# Patient Record
Sex: Male | Born: 1985
Health system: Southern US, Community
[De-identification: ages and names within clinical notes are randomized; demographics above are authoritative.]

## PROBLEM LIST (undated history)

## (undated) DIAGNOSIS — E559 Vitamin D deficiency, unspecified: Secondary | ICD-10-CM

## (undated) DIAGNOSIS — E291 Testicular hypofunction: Secondary | ICD-10-CM

## (undated) DIAGNOSIS — I10 Essential (primary) hypertension: Secondary | ICD-10-CM

## (undated) HISTORY — DX: Vitamin D deficiency, unspecified: E55.9

## (undated) HISTORY — PX: KNEE ARTHROSCOPY: SHX127

## (undated) HISTORY — DX: Essential (primary) hypertension: I10

## (undated) HISTORY — PX: WISDOM TOOTH EXTRACTION: SHX21

## (undated) HISTORY — DX: Testicular hypofunction: E29.1

---

## 2013-01-21 ENCOUNTER — Other Ambulatory Visit: Payer: Self-pay | Admitting: Physician Assistant

## 2013-01-21 MED ORDER — TESTOSTERONE CYPIONATE 200 MG/ML IM SOLN: INTRAMUSCULAR | Status: DC

## 2013-01-25 ENCOUNTER — Other Ambulatory Visit: Payer: Self-pay | Admitting: Internal Medicine

## 2013-01-25 ENCOUNTER — Encounter: Payer: Self-pay | Admitting: Internal Medicine

## 2013-01-25 MED ORDER — AMPHETAMINE-DEXTROAMPHETAMINE 20 MG PO TABS: ORAL_TABLET | ORAL | Status: DC

## 2013-03-12 ENCOUNTER — Other Ambulatory Visit: Payer: Self-pay | Admitting: Internal Medicine

## 2013-03-14 ENCOUNTER — Encounter: Payer: Self-pay | Admitting: Internal Medicine

## 2013-03-14 ENCOUNTER — Ambulatory Visit (INDEPENDENT_AMBULATORY_CARE_PROVIDER_SITE_OTHER): Payer: 59 | Admitting: Internal Medicine

## 2013-03-14 VITALS — BP 130/84 | HR 76 | Temp 99.3°F | Resp 16 | Wt 239.8 lb

## 2013-03-14 DIAGNOSIS — E291 Testicular hypofunction: Secondary | ICD-10-CM

## 2013-03-14 DIAGNOSIS — E559 Vitamin D deficiency, unspecified: Secondary | ICD-10-CM

## 2013-03-14 DIAGNOSIS — E349 Endocrine disorder, unspecified: Secondary | ICD-10-CM | POA: Insufficient documentation

## 2013-03-14 DIAGNOSIS — I10 Essential (primary) hypertension: Secondary | ICD-10-CM | POA: Insufficient documentation

## 2013-03-14 DIAGNOSIS — F988 Other specified behavioral and emotional disorders with onset usually occurring in childhood and adolescence: Secondary | ICD-10-CM

## 2013-03-14 MED ORDER — CITALOPRAM HYDROBROMIDE 40 MG PO TABS: 40.0000 mg | ORAL_TABLET | Freq: Every day | ORAL | Status: DC

## 2013-03-14 MED ORDER — AMPHETAMINE-DEXTROAMPHETAMINE 20 MG PO TABS: ORAL_TABLET | ORAL | Status: DC

## 2013-03-14 MED ORDER — ALPRAZOLAM 1 MG PO TABS: ORAL_TABLET | ORAL | Status: DC

## 2013-03-14 NOTE — Patient Instructions (Signed)
Testosterone injection What is this medicine? TESTOSTERONE (tes TOS ter one) is the main male hormone. It supports normal male development such as muscle growth, facial hair, and deep voice. It is used in males to treat low testosterone levels. This medicine may be used for other purposes; ask your health care provider or pharmacist if you have questions. COMMON BRAND NAME(S): Andro-L.A., Aveed, Delatestryl, Depo-Testosterone, Virilon What should I tell my health care provider before I take this medicine? They need to know if you have any of these conditions: -breast cancer -diabetes -heart disease -kidney disease -liver disease -lung disease -prostate cancer, enlargement -an unusual or allergic reaction to testosterone, other medicines, foods, dyes, or preservatives -pregnant or trying to get pregnant -breast-feeding How should I use this medicine? This medicine is for injection into a muscle. It is usually given by a health care professional in a hospital or clinic setting. Contact your pediatrician regarding the use of this medicine in children. While this medicine may be prescribed for children as young as 2 years of age for selected conditions, precautions do apply. Overdosage: If you think you have taken too much of this medicine contact a poison control center or emergency room at once. NOTE: This medicine is only for you. Do not share this medicine with others. What if I miss a dose? Try not to miss a dose. Your doctor or health care professional will tell you when your next injection is due. Notify the office if you are unable to keep an appointment. What may interact with this medicine? -medicines for diabetes -medicines that treat or prevent blood clots like warfarin -oxyphenbutazone -propranolol -steroid medicines like prednisone or cortisone This list may not describe all possible interactions. Give your health care provider a list of all the medicines, herbs,  non-prescription drugs, or dietary supplements you use. Also tell them if you smoke, drink alcohol, or use illegal drugs. Some items may interact with your medicine. What should I watch for while using this medicine? Visit your doctor or health care professional for regular checks on your progress. They will need to check the level of testosterone in your blood. This medicine may affect blood sugar levels. If you have diabetes, check with your doctor or health care professional before you change your diet or the dose of your diabetic medicine. This drug is banned from use in athletes by most athletic organizations. What side effects may I notice from receiving this medicine? Side effects that you should report to your doctor or health care professional as soon as possible: -allergic reactions like skin rash, itching or hives, swelling of the face, lips, or tongue -breast enlargement -breathing problems -changes in mood, especially anger, depression, or rage -dark urine -general ill feeling or flu-like symptoms -light-colored stools -loss of appetite, nausea -nausea, vomiting -right upper belly pain -stomach pain -swelling of ankles -too frequent or persistent erections -trouble passing urine or change in the amount of urine -unusually weak or tired -yellowing of the eyes or skin Additional side effects that can occur in women include: -deep or hoarse voice -facial hair growth -irregular menstrual periods Side effects that usually do not require medical attention (report to your doctor or health care professional if they continue or are bothersome): -acne -change in sex drive or performance -hair loss -headache This list may not describe all possible side effects. Call your doctor for medical advice about side effects. You may report side effects to FDA at 1-800-FDA-1088. Where should I keep my medicine? Keep  out of the reach of children. This medicine can be abused. Keep your  medicine in a safe place to protect it from theft. Do not share this medicine with anyone. Selling or giving away this medicine is dangerous and against the law. Store at room temperature between 20 and 25 degrees C (68 and 77 degrees F). Do not freeze. Protect from light. Follow the directions for the product you are prescribed. Throw away any unused medicine after the expiration date. NOTE: This sheet is a summary. It may not cover all possible information. If you have questions about this medicine, talk to your doctor, pharmacist, or health care provider.  2014, Elsevier/Gold Standard. (2007-04-13 16:13:46)  Vitamin D Deficiency Vitamin D is an important vitamin that your body needs. Having too little of it in your body is called a deficiency. A very bad deficiency can make your bones soft and can cause a condition called rickets.  Vitamin D is important to your body for different reasons, such as:   It helps your body absorb 2 minerals called calcium and phosphorus.  It helps make your bones healthy.  It may prevent some diseases, such as diabetes and multiple sclerosis.  It helps your muscles and heart. You can get vitamin D in several ways. It is a natural part of some foods. The vitamin is also added to some dairy products and cereals. Some people take vitamin D supplements. Also, your body makes vitamin D when you are in the sun. It changes the sun's rays into a form of the vitamin that your body can use. CAUSES   Not eating enough foods that contain vitamin D.  Not getting enough sunlight.  Having certain digestive system diseases that make it hard to absorb vitamin D. These diseases include Crohn's disease, chronic pancreatitis, and cystic fibrosis.  Having a surgery in which part of the stomach or small intestine is removed.  Being obese. Fat cells pull vitamin D out of your blood. That means that obese people may not have enough vitamin D left in their blood and in other body  tissues.  Having chronic kidney or liver disease. RISK FACTORS Risk factors are things that make you more likely to develop a vitamin D deficiency. They include:  Being older.  Not being able to get outside very much.  Living in a nursing home.  Having had broken bones.  Having weak or thin bones (osteoporosis).  Having a disease or condition that changes how your body absorbs vitamin D.  Having dark skin.  Some medicines such as seizure medicines or steroids.  Being overweight or obese. SYMPTOMS Mild cases of vitamin D deficiency may not have any symptoms. If you have a very bad case, symptoms may include:  Bone pain.  Muscle pain.  Falling often.  Broken bones caused by a minor injury, due to osteoporosis. DIAGNOSIS A blood test is the best way to tell if you have a vitamin D deficiency. TREATMENT Vitamin D deficiency can be treated in different ways. Treatment for vitamin D deficiency depends on what is causing it. Options include:  Taking vitamin D supplements.  Taking a calcium supplement. Your caregiver will suggest what dose is best for you. HOME CARE INSTRUCTIONS  Take any supplements that your caregiver prescribes. Follow the directions carefully. Take only the suggested amount.  Have your blood tested 2 months after you start taking supplements.  Eat foods that contain vitamin D. Healthy choices include:  Fortified dairy products, cereals, or juices. Fortified  means vitamin D has been added to the food. Check the label on the package to be sure.  Fatty fish like salmon or trout.  Eggs.  Oysters.  Do not use a tanning bed.  Keep your weight at a healthy level. Lose weight if you need to.  Keep all follow-up appointments. Your caregiver will need to perform blood tests to make sure your vitamin D deficiency is going away. SEEK MEDICAL CARE IF:  You have any questions about your treatment.  You continue to have symptoms of vitamin D  deficiency.  You have nausea or vomiting.  You are constipated.  You feel confused.  You have severe abdominal or back pain. MAKE SURE YOU:  Understand these instructions.  Will watch your condition.  Will get help right away if you are not doing well or get worse. Document Released: 04/25/2011 Document Revised: 05/28/2012 Document Reviewed: 04/25/2011 [facility] Patient Information 2014 [place].

## 2013-03-15 LAB — TESTOSTERONE: TESTOSTERONE: 484 ng/dL (ref 300–890)

## 2013-03-15 LAB — VITAMIN D 25 HYDROXY (VIT D DEFICIENCY, FRACTURES): Vit D, 25-Hydroxy: 44 ng/mL (ref 30–89)

## 2013-03-15 NOTE — Progress Notes (Signed)
Patient ID: Ernest Richmond, male   DOB: 12-02-1985, 28 y.o.   MRN: [id number]   This very nice 28 y.o. SWM presents for follow up with Hx/o labile Hypertension,  mild Hyperlipidemia, Testosterone and Vitamin Deficiency and ADD.    Today's BP: 130/84 mmHg . Patient denies any cardiac type chest pain, palpitations, dyspnea/orthopnea/PND, dizziness, claudication, or dependent edema.   Hyperlipidemia is controlled with diet. Last Cholesterol was  194, Triglycerides were 211, HDL 50 and LDL 102 - near goal. Pateint continues to feel improved stamina and sense of well being on Testosterone replacement therapy   Also, the patient has history of ADD and has been doing well judiciously using his Adderall 1&1/2 to 2 tabs usually only on work days and reports improved work functions and concentration with the medication. He also reports using his evening dosing of alprazolam allowing him anxiety relief and avoiding use of ETOH.   Further, Patient has history of Vitamin D Deficiency and he supplements vitamin D without any suspected side-effects.    Medication List         ALPRAZolam 1 MG tablet  Commonly known as:  XANAX  1/2 to 1 tablet 2 or 3  X daily as needed for anxiety     amphetamine-dextroamphetamine 20 MG tablet  Commonly known as:  ADDERALL  1/2 to 1 tablet 2 or 3 x daily for ADD     cholecalciferol 1000 UNITS tablet  Commonly known as:  VITAMIN D  Take 1,000 Units by mouth 3 (three) times daily.     citalopram 40 MG tablet  Commonly known as:  CELEXA  Take 1 tablet (40 mg total) by mouth daily. For mood and anxiety     testosterone cypionate 200 MG/ML injection  Commonly known as:  DEPO-TESTOSTERONE  3 cc q 2 weeks or as directed         No Known Allergies  PMHx:   Past Medical History  Diagnosis Date  . Hypertension   . Hypogonadism male   . Vitamin D deficiency     FHx:    Reviewed / unchanged  SHx:    Reviewed / unchanged  Systems Review: Constitutional: Denies  fever, chills, wt changes, headaches, insomnia, fatigue, night sweats, change in appetite. Eyes: Denies redness, blurred vision, diplopia, discharge, itchy, watery eyes.  ENT: Denies discharge, congestion, post nasal drip, epistaxis, sore throat, earache, hearing loss, dental pain, tinnitus, vertigo, sinus pain, snoring.  CV: Denies chest pain, palpitations, irregular heartbeat, syncope, dyspnea, diaphoresis, orthopnea, PND, claudication, edema. Respiratory: denies cough, dyspnea, DOE, pleurisy, hoarseness, laryngitis, wheezing.  Gastrointestinal: Denies dysphagia, odynophagia, heartburn, reflux, water brash, abdominal pain or cramps, nausea, vomiting, bloating, diarrhea, constipation, hematemesis, melena, hematochezia,  or hemorrhoids. Genitourinary: Denies dysuria, frequency, urgency, nocturia, hesitancy, discharge, hematuria, flank pain. Musculoskeletal: Denies arthralgias, myalgias, stiffness, jt. swelling, pain, limp, strain/sprain.  Skin: Denies pruritus, rash, hives, warts, acne, eczema, change in skin lesion(s). Neuro: No weakness, tremor, incoordination, spasms, paresthesia, or pain. Psychiatric: Denies confusion, memory loss, or sensory loss. Endo: Denies change in weight, skin, hair change.  Heme/Lymph: No excessive bleeding, bruising, orenlarged lymph nodes.  BP: 130/84  Pulse: 76  Temp: 99.3 F (37.4 C)  Resp: 16    On Exam:  Appears well nourished - in no distress. HEENT - neg Neck: Supple. Thyroid nl. Car 2+/2+ without bruits, nodes or JVD. Chest: Respirations nl with BS clear & equal w/o rales, rhonchi, wheezing or stridor.  Cor: Heart sounds normal w/ regular rate and rhythm  without sig. murmurs, gallops, clicks, or rubs. Peripheral pulses normal and equal  without edema.  Musculoskeletal: Full ROM all peripheral extremities, joint stability, 5/5 strength, and normal gait.  Skin: Warm, dry without exposed rashes, lesions, ecchymosis apparent.  Neuro: Cranial nerves  intact, reflexes equal bilaterally. Sensory-motor testing grossly intact. Tendon reflexes grossly intact.  Pysch: Alert & oriented x 3. Insight and judgement nl & appropriate. No ideations.  Assessment and Plan:  1. Elevated, labile - Continue monitor blood pressures  2. Hyperlipidemia - Continue diet & exerciseand ontinue monitor periodic cholesterol checks   3. ADD - continue periodic monitoring of meds and compliance.  4. Vitamin D Deficiency - Continue supplementation.  5. Testosterone Deficiency  Recommended regular exercise, BP monitoring, weight control, and discussed med and SE's.

## 2013-03-21 ENCOUNTER — Telehealth: Payer: Self-pay | Admitting: *Deleted

## 2013-03-21 ENCOUNTER — Other Ambulatory Visit: Payer: Self-pay | Admitting: Internal Medicine

## 2013-03-21 DIAGNOSIS — F988 Other specified behavioral and emotional disorders with onset usually occurring in childhood and adolescence: Secondary | ICD-10-CM

## 2013-03-21 MED ORDER — AMPHETAMINE-DEXTROAMPHETAMINE 20 MG PO TABS: ORAL_TABLET | ORAL | Status: DC

## 2013-03-21 NOTE — Telephone Encounter (Signed)
Patient called concerning labs.  Per Dr Melford Aase, testosterone level good but vitamin D low at 44. Patient needs to increase vitamin D from 3000 units to 6000 units daily.  Patient adevised he needs to pick up RX for Adderall.

## 2013-05-23 ENCOUNTER — Other Ambulatory Visit: Payer: Self-pay | Admitting: Internal Medicine

## 2013-05-23 DIAGNOSIS — E291 Testicular hypofunction: Secondary | ICD-10-CM

## 2013-05-23 DIAGNOSIS — F988 Other specified behavioral and emotional disorders with onset usually occurring in childhood and adolescence: Secondary | ICD-10-CM

## 2013-05-23 MED ORDER — "NEEDLE (DISP) 21G X 1"" MISC": Status: DC

## 2013-05-23 MED ORDER — AMPHETAMINE-DEXTROAMPHETAMINE 20 MG PO TABS: ORAL_TABLET | ORAL | Status: DC

## 2013-06-21 ENCOUNTER — Ambulatory Visit (INDEPENDENT_AMBULATORY_CARE_PROVIDER_SITE_OTHER): Payer: 59 | Admitting: Internal Medicine

## 2013-06-21 ENCOUNTER — Encounter: Payer: Self-pay | Admitting: Internal Medicine

## 2013-06-21 VITALS — BP 122/72 | HR 72 | Temp 98.1°F | Resp 18 | Ht 72.5 in | Wt 242.8 lb

## 2013-06-21 DIAGNOSIS — E782 Mixed hyperlipidemia: Secondary | ICD-10-CM | POA: Insufficient documentation

## 2013-06-21 DIAGNOSIS — I10 Essential (primary) hypertension: Secondary | ICD-10-CM

## 2013-06-21 DIAGNOSIS — Z113 Encounter for screening for infections with a predominantly sexual mode of transmission: Secondary | ICD-10-CM

## 2013-06-21 DIAGNOSIS — Z79899 Other long term (current) drug therapy: Secondary | ICD-10-CM | POA: Insufficient documentation

## 2013-06-21 DIAGNOSIS — Z0000 Encounter for general adult medical examination without abnormal findings: Secondary | ICD-10-CM

## 2013-06-21 DIAGNOSIS — R7401 Elevation of levels of liver transaminase levels: Secondary | ICD-10-CM

## 2013-06-21 DIAGNOSIS — E291 Testicular hypofunction: Secondary | ICD-10-CM

## 2013-06-21 DIAGNOSIS — Z1212 Encounter for screening for malignant neoplasm of rectum: Secondary | ICD-10-CM

## 2013-06-21 DIAGNOSIS — R740 Nonspecific elevation of levels of transaminase and lactic acid dehydrogenase [LDH]: Secondary | ICD-10-CM

## 2013-06-21 DIAGNOSIS — E559 Vitamin D deficiency, unspecified: Secondary | ICD-10-CM

## 2013-06-21 DIAGNOSIS — F988 Other specified behavioral and emotional disorders with onset usually occurring in childhood and adolescence: Secondary | ICD-10-CM

## 2013-06-21 DIAGNOSIS — Z111 Encounter for screening for respiratory tuberculosis: Secondary | ICD-10-CM

## 2013-06-21 LAB — HEMOGLOBIN A1C
HEMOGLOBIN A1C: 5.2 % (ref <5.7)
Mean Plasma Glucose: 103 mg/dL (ref <117)

## 2013-06-21 LAB — CBC WITH DIFFERENTIAL/PLATELET
Basophils Absolute: 0 10*3/uL (ref 0.0–0.1)
Basophils Relative: 0 % (ref 0–1)
EOS PCT: 2 % (ref 0–5)
Eosinophils Absolute: 0.1 10*3/uL (ref 0.0–0.7)
HCT: 44.9 % (ref 39.0–52.0)
HEMOGLOBIN: 15.4 g/dL (ref 13.0–17.0)
LYMPHS ABS: 1.2 10*3/uL (ref 0.7–4.0)
Lymphocytes Relative: 24 % (ref 12–46)
MCH: 31.5 pg (ref 26.0–34.0)
MCHC: 34.3 g/dL (ref 30.0–36.0)
MCV: 91.8 fL (ref 78.0–100.0)
MONOS PCT: 7 % (ref 3–12)
Monocytes Absolute: 0.3 10*3/uL (ref 0.1–1.0)
Neutro Abs: 3.3 10*3/uL (ref 1.7–7.7)
Neutrophils Relative %: 67 % (ref 43–77)
Platelets: 204 10*3/uL (ref 150–400)
RBC: 4.89 MIL/uL (ref 4.22–5.81)
RDW: 13.7 % (ref 11.5–15.5)
WBC: 4.9 10*3/uL (ref 4.0–10.5)

## 2013-06-21 MED ORDER — TESTOSTERONE CYPIONATE 200 MG/ML IM SOLN: INTRAMUSCULAR | Status: DC

## 2013-06-21 MED ORDER — ASPIRIN EC 81 MG PO TBEC: 81.0000 mg | DELAYED_RELEASE_TABLET | Freq: Every day | ORAL | Status: AC

## 2013-06-21 MED ORDER — ALPRAZOLAM 1 MG PO TABS: ORAL_TABLET | ORAL | Status: DC

## 2013-06-21 MED ORDER — AMPHETAMINE-DEXTROAMPHETAMINE 20 MG PO TABS: ORAL_TABLET | ORAL | Status: DC

## 2013-06-21 MED ORDER — "NEEDLE (DISP) 21G X 1"" MISC": Status: DC

## 2013-06-21 MED ORDER — CITALOPRAM HYDROBROMIDE 40 MG PO TABS: 40.0000 mg | ORAL_TABLET | Freq: Every day | ORAL | Status: DC

## 2013-06-21 MED ORDER — ALPRAZOLAM 1 MG PO TABS: ORAL_TABLET | ORAL | Status: AC

## 2013-06-21 NOTE — Progress Notes (Signed)
Patient ID: Ernest Richmond, male   DOB: [birth year], 28 y.o.   MRN: [id number]   Annual Screening Comprehensive Examination  This very nice 28 y.o.  SWM presents for complete physical.  Patient has been followed for labile HTN,  Hyperlipidemia and Vitamin D Deficiency.   Labile HTN predates since 2011 and has been monitored expectantly. Patient's BP has been controlled and today's BP: 122/72 mmHg. Patient denies any cardiac symptoms as chest pain, palpitations, shortness of breath, dizziness or ankle swelling.   Patient's hyperlipidemia is controlled with diet and medications. Patient denies myalgias or other medication SE's. Last cholesterol last visit was 194, triglycerides 211, HDL 50 and LDL 102.     Patient is screened for  prediabetes  and last A1c 4.7% / insulin 7. Patient denies reactive hypoglycemic symptoms, visual blurring, diabetic polys or paresthesias.    Finally, patient has history of Vitamin D Deficiency of 8 in 2011 and last vitamin D was 35 in May 2014 and pt was advised to increase dose of Vit D.    Medication List     ALPRAZolam 1 MG tablet  Commonly known as:  XANAX  1/2 to 1 tablet 2 or 3  X daily as needed for anxiety     amphetamine-dextroamphetamine 20 MG tablet  Commonly known as:  ADDERALL  1/2 to 1 tablet 2 or 3 x daily for ADD     aspirin EC 81 MG tablet  Take 1 tablet (81 mg total) by mouth daily.     cholecalciferol 1000 UNITS tablet  Commonly known as:  VITAMIN D  Take 1,000 Units by mouth 3 (three) times daily.     citalopram 40 MG tablet  Commonly known as:  CELEXA  Take 1 tablet (40 mg total) by mouth daily. For mood and anxiety     multivitamin tablet  Take 1 tablet by mouth daily.     NEEDLE (DISP) 21 G 21G X 1" Misc  Commonly known as:  YALE DISP NEEDLES 21GX1"  1 & 1/2 "     21 G needles and 3 cc syringes     testosterone cypionate 200 MG/ML injection  Commonly known as:  DEPO-TESTOSTERONE  3 cc q 2 weeks or as directed       No  Known Allergies  Past Medical History  Diagnosis Date  . Hypertension   . Hypogonadism male   . Vitamin D deficiency    Past Surgical History  Procedure Laterality Date  . Wisdom tooth extraction    . Knee arthroscopy Left    Family History  Problem Relation Age of Onset  . Arthritis Mother   . Hyperlipidemia Father   . Hypertension Father   . Diabetes Father   . Gout Father    History   Social History  . Marital Status: Single    Spouse Name: N/A    Number of Children: N/A  . Years of Education: N/A   Occupational History  . Works as Company secretary for [employer] History Main Topics  . Smoking status: Former Smoker -- 0.60 packs/day    Quit date: 03/17/2012  . Smokeless tobacco: No  . Alcohol Use: 7.0 oz/week    14 drink(s) per week     Comment: beer  . Drug Use: No  . Sexual Activity: Active   Social History Narrative  . cohabitates with GF of 5 yrs    ROS Constitutional: Denies fever, chills, weight loss/gain, headaches, insomnia, fatigue, night sweats  or change in appetite. Eyes: Denies redness, blurred vision, diplopia, discharge, itchy or watery eyes.  ENT: Denies discharge, congestion, post nasal drip, epistaxis, sore throat, earache, hearing loss, dental pain, Tinnitus, Vertigo, Sinus pain or snoring.  Cardio: Denies chest pain, palpitations, irregular heartbeat, syncope, dyspnea, diaphoresis, orthopnea, PND, claudication or edema Respiratory: denies cough, dyspnea, DOE, pleurisy, hoarseness, laryngitis or wheezing.  Gastrointestinal: Denies dysphagia, heartburn, reflux, water brash, pain, cramps, nausea, vomiting, bloating, diarrhea, constipation, hematemesis, melena, hematochezia, jaundice or hemorrhoids Genitourinary: Denies dysuria, frequency, urgency, nocturia, hesitancy, discharge, hematuria or flank pain Musculoskeletal: Denies arthralgia, myalgia, stiffness, Jt. Swelling, pain, limp or strain/sprain. Skin: Denies puritis, rash,  hives, warts, acne, eczema or change in skin lesion Neuro: No weakness, tremor, incoordination, spasms, paresthesia or pain Psychiatric: Denies confusion, memory loss or sensory loss Endocrine: Denies change in weight, skin, hair change, nocturia, and paresthesia, diabetic polys, visual blurring or hyper / hypo glycemic episodes.  Heme/Lymph: No excessive bleeding, bruising or enlarged lymph nodes.  Physical Exam  BP 122/72  Pulse 72  Temp(Src) 98.1 F (36.7 C) (Temporal)  Resp 18  Ht 6' 0.5" (1.842 m)  Wt 242 lb 12.8 oz (110.133 kg)  BMI 32.46 kg/m2  General Appearance: Well nourished, in no apparent distress. Eyes: PERRLA, EOMs, conjunctiva no swelling or erythema, normal fundi and vessels. Sinuses: No frontal/maxillary tenderness ENT/Mouth: EACs patent / TMs  nl. Nares clear without erythema, swelling, mucoid exudates. Oral hygiene is good. No erythema, swelling, or exudate. Tongue normal, non-obstructing. Tonsils not swollen or erythematous. Hearing normal.  Neck: Supple, thyroid normal. No bruits, nodes or JVD. Respiratory: Respiratory effort normal.  BS equal and clear bilateral without rales, rhonci, wheezing or stridor. Cardio: Heart sounds are normal with regular rate and rhythm and no murmurs, rubs or gallops. Peripheral pulses are normal and equal bilaterally without edema. No aortic or femoral bruits. Chest: symmetric with normal excursions and percussion.  Abdomen: Flat, soft, with bowl sounds. Nontender, no guarding, rebound, hernias, masses, or organomegaly.  Lymphatics: Non tender without lymphadenopathy.  Genitourinary: No hernias.Testes nl. Musculoskeletal: Full ROM all peripheral extremities, joint stability, 5/5 strength, and normal gait. Skin: Warm and dry without rashes, lesions, cyanosis, clubbing or  ecchymosis.  Neuro: Cranial nerves intact, reflexes equal bilaterally. Normal muscle tone, no cerebellar symptoms. Sensation intact.  Pysch: Awake and oriented X 3,  normal affect, insight and judgment appropriate.   Assessment and Plan  1. Annual Screening Examination 2. Hypertension, labile 3. Hyperlipidemia 4. Testosterone Derficiency 5. Vitamin D Deficiency 6. ADD   Continue prudent diet as discussed, weight control, BP monitoring, regular exercise, and medications as discussed.  Discussed med effects and SE's. Routine screening labs and tests as requested with regular follow-up as recommended. Also recommended take Low dose ASA 81 mg daily.  ROV 6 mo

## 2013-06-21 NOTE — Patient Instructions (Signed)

## 2013-06-22 LAB — URINALYSIS, MICROSCOPIC ONLY
BACTERIA UA: NONE SEEN
CASTS: NONE SEEN
Crystals: NONE SEEN
Squamous Epithelial / LPF: NONE SEEN

## 2013-06-22 LAB — BASIC METABOLIC PANEL WITH GFR
BUN: 18 mg/dL (ref 6–23)
CALCIUM: 9.4 mg/dL (ref 8.4–10.5)
CO2: 23 meq/L (ref 19–32)
CREATININE: 0.95 mg/dL (ref 0.50–1.35)
Chloride: 105 mEq/L (ref 96–112)
GFR, Est African American: >89 mL/min
GFR, Est Non African American: >89 mL/min
GLUCOSE: 84 mg/dL (ref 70–99)
Potassium: 4.4 mEq/L (ref 3.5–5.3)
Sodium: 139 mEq/L (ref 135–145)

## 2013-06-22 LAB — LIPID PANEL
Cholesterol: 151 mg/dL (ref 0–200)
HDL: 39 mg/dL — ABNORMAL LOW (ref >39)
LDL CALC: 90 mg/dL (ref 0–99)
Total CHOL/HDL Ratio: 3.9 Ratio
Triglycerides: 111 mg/dL (ref <150)
VLDL: 22 mg/dL (ref 0–40)

## 2013-06-22 LAB — MICROALBUMIN / CREATININE URINE RATIO
Creatinine, Urine: 138.5 mg/dL
Microalb Creat Ratio: 3.6 mg/g (ref 0.0–30.0)
Microalb, Ur: 0.5 mg/dL (ref 0.00–1.89)

## 2013-06-22 LAB — HEPATIC FUNCTION PANEL
ALBUMIN: 4.6 g/dL (ref 3.5–5.2)
ALK PHOS: 68 U/L (ref 39–117)
ALT: 22 U/L (ref 0–53)
AST: 26 U/L (ref 0–37)
Bilirubin, Direct: 0.1 mg/dL (ref 0.0–0.3)
Indirect Bilirubin: 0.4 mg/dL (ref 0.2–1.2)
Total Bilirubin: 0.5 mg/dL (ref 0.2–1.2)
Total Protein: 6.9 g/dL (ref 6.0–8.3)

## 2013-06-22 LAB — INSULIN, FASTING: INSULIN FASTING, SERUM: 4 u[IU]/mL (ref 3–28)

## 2013-06-22 LAB — HEPATITIS C ANTIBODY: HCV Ab: NEGATIVE

## 2013-06-22 LAB — TESTOSTERONE: Testosterone: 810 ng/dL (ref 300–890)

## 2013-06-22 LAB — VITAMIN D 25 HYDROXY (VIT D DEFICIENCY, FRACTURES): VIT D 25 HYDROXY: 43 ng/mL (ref 30–89)

## 2013-06-22 LAB — MAGNESIUM: MAGNESIUM: 2.1 mg/dL (ref 1.5–2.5)

## 2013-06-22 LAB — VITAMIN B12: Vitamin B-12: 344 pg/mL (ref 211–911)

## 2013-06-22 LAB — HEPATITIS B SURFACE ANTIBODY,QUALITATIVE: Hep B S Ab: NEGATIVE

## 2013-06-22 LAB — HEPATITIS A ANTIBODY, TOTAL: Hep A Total Ab: NONREACTIVE

## 2013-06-22 LAB — HEPATITIS B CORE ANTIBODY, TOTAL: Hep B Core Total Ab: NONREACTIVE

## 2013-06-22 LAB — TSH: TSH: 0.468 u[IU]/mL (ref 0.350–4.500)

## 2013-06-24 LAB — HEPATITIS B E ANTIBODY: HEPATITIS BE ANTIBODY: NONREACTIVE

## 2013-06-24 LAB — TB SKIN TEST
INDURATION: 0 mm
TB SKIN TEST: NEGATIVE

## 2013-08-14 ENCOUNTER — Other Ambulatory Visit: Payer: Self-pay | Admitting: *Deleted

## 2013-08-14 DIAGNOSIS — F988 Other specified behavioral and emotional disorders with onset usually occurring in childhood and adolescence: Secondary | ICD-10-CM

## 2013-08-14 MED ORDER — AMPHETAMINE-DEXTROAMPHETAMINE 20 MG PO TABS: ORAL_TABLET | ORAL | Status: DC

## 2013-09-05 ENCOUNTER — Ambulatory Visit (INDEPENDENT_AMBULATORY_CARE_PROVIDER_SITE_OTHER): Payer: 59

## 2013-09-05 ENCOUNTER — Ambulatory Visit (INDEPENDENT_AMBULATORY_CARE_PROVIDER_SITE_OTHER): Payer: 59 | Admitting: Emergency Medicine

## 2013-09-05 VITALS — BP 122/86 | HR 70 | Temp 98.2°F | Resp 16 | Ht 71.25 in | Wt 245.0 lb

## 2013-09-05 DIAGNOSIS — M79609 Pain in unspecified limb: Secondary | ICD-10-CM

## 2013-09-05 DIAGNOSIS — M79641 Pain in right hand: Secondary | ICD-10-CM

## 2013-09-05 DIAGNOSIS — S62309A Unspecified fracture of unspecified metacarpal bone, initial encounter for closed fracture: Secondary | ICD-10-CM

## 2013-09-05 NOTE — Progress Notes (Signed)
[facility] [address], [place] [address] [phone]  Date:  09/05/2013   Name:  Ernest Richmond   DOB:  Sep 08, 1985   MRN:  [id number]  PCP:  Alesia Richards, MD    Chief Complaint: Hand Pain   History of Present Illness:  Ernest Richmond is a 28 y.o. very pleasant male patient who presents with the following:  Punched a wall while on vacation.  Has pain and swelling and tenderness third MCP.  No improvement with over the counter medications or other home remedies. Denies other complaint or health concern today.   Patient Active Problem List   Diagnosis Date Noted  . Hyperlipidemia 06/21/2013  . Encounter for long-term (current) use of other medications 06/21/2013  . ADD (attention deficit disorder) 06/21/2013  . Labile Hypertension 03/14/2013  . Testosterone Deficiency 03/14/2013  . Vitamin D Deficiency 03/14/2013    Past Medical History  Diagnosis Date  . Hypertension   . Hypogonadism male   . Vitamin D deficiency     Past Surgical History  Procedure Laterality Date  . Wisdom tooth extraction    . Knee arthroscopy Left     History  Substance Use Topics  . Smoking status: Former Smoker -- 0.00 packs/day    Quit date: 03/17/2012  . Smokeless tobacco: Not on file  . Alcohol Use: 10.0 oz/week    20 drink(s) per week     Comment: beer    Family History  Problem Relation Age of Onset  . Arthritis Mother   . Hyperlipidemia Father   . Hypertension Father   . Diabetes Father   . Gout Father     No Known Allergies  Medication list has been reviewed and updated.  Current Outpatient Prescriptions on File Prior to Visit  Medication Sig Dispense Refill  . ALPRAZolam (XANAX) 1 MG tablet 1/2 to 1 tablet 2 or 3  X daily as needed for anxiety  90 tablet  5  . amphetamine-dextroamphetamine (ADDERALL) 20 MG tablet 1/2 to 1 tablet 2 or 3 x daily for ADD  90 tablet  0  . aspirin EC 81 MG tablet Take 1 tablet (81 mg total) by mouth daily.  150  tablet  2  . cholecalciferol (VITAMIN D) 1000 UNITS tablet Take 1,000 Units by mouth 3 (three) times daily.      . citalopram (CELEXA) 40 MG tablet Take 1 tablet (40 mg total) by mouth daily. For mood and anxiety  90 tablet  99  . Multiple Vitamin (MULTIVITAMIN) tablet Take 1 tablet by mouth daily.      Marland Kitchen NEEDLE, DISP, 21 G (YALE DISP NEEDLES 21GX1") 21G X 1" MISC 1 & 1/2 "     21 G needles and 3 cc syringes  25 each  99  . testosterone cypionate (DEPO-TESTOSTERONE) 200 MG/ML injection 3 cc q 2 weeks or as directed  10 mL  3   No current facility-administered medications on file prior to visit.    Review of Systems:  As per HPI, otherwise negative.    Physical Examination: Filed Vitals:   09/05/13 0914  BP: 122/86  Pulse: 70  Temp: 98.2 F (36.8 C)  Resp: 16   Filed Vitals:   09/05/13 0914  Height: 5' 11.25" (1.81 m)  Weight: 245 lb (111.131 kg)   Body mass index is 33.92 kg/(m^2). Ideal Body Weight: Weight in (lb) to have BMI = 25: 180.1   GEN: WDWN, NAD, Non-toxic, Alert & Oriented x  3 HEENT: Atraumatic, Normocephalic.  Ears and Nose: No external deformity. EXTR: No clubbing/cyanosis/edema NEURO: Normal gait.  PSYCH: Normally interactive. Conversant. Not depressed or anxious appearing.  Calm demeanor.  RIGHT hand:  Tender swollen third MCP.  No deformity.  Guards finger   Assessment and Plan: Fracture 3rd MCP Ulnar gutter Ortho  Signed,  Ellison Carwin, MD   [facility] reading (PRIMARY) by  Dr. Ouida Sills.  fx 3rd MCP.

## 2013-09-11 ENCOUNTER — Telehealth: Payer: Self-pay

## 2013-09-11 NOTE — Telephone Encounter (Signed)
Latisha from [facility] is needing more details about if the patient has a fracture or not when she called the patient he told her that it was not a fracture but our referral states it was   She would like a call back at [phone] and then she will be able to call the patient back and schedule

## 2013-09-11 NOTE — Telephone Encounter (Signed)
Attempted call, no answer Per image findings: There is no evidence of fracture or dislocation

## 2013-09-13 NOTE — Telephone Encounter (Signed)
Spoke to [employer]- gave information on imaging results.

## 2013-09-13 NOTE — Telephone Encounter (Signed)
Katie requested imaging report faxed to her attention- [phone]. I have faxed as requested.

## 2013-10-25 ENCOUNTER — Other Ambulatory Visit: Payer: Self-pay | Admitting: Internal Medicine

## 2013-10-25 DIAGNOSIS — F988 Other specified behavioral and emotional disorders with onset usually occurring in childhood and adolescence: Secondary | ICD-10-CM

## 2013-10-25 MED ORDER — AMPHETAMINE-DEXTROAMPHETAMINE 20 MG PO TABS: ORAL_TABLET | ORAL | Status: DC

## 2013-12-18 ENCOUNTER — Other Ambulatory Visit: Payer: Self-pay | Admitting: Internal Medicine

## 2013-12-18 DIAGNOSIS — F988 Other specified behavioral and emotional disorders with onset usually occurring in childhood and adolescence: Secondary | ICD-10-CM

## 2013-12-18 MED ORDER — AMPHETAMINE-DEXTROAMPHETAMINE 20 MG PO TABS
ORAL_TABLET | ORAL | Status: DC
Start: 2013-12-18 — End: 2014-02-18

## 2013-12-24 ENCOUNTER — Encounter: Payer: Self-pay | Admitting: Internal Medicine

## 2013-12-24 ENCOUNTER — Ambulatory Visit: Payer: 59 | Admitting: Internal Medicine

## 2013-12-24 NOTE — Progress Notes (Signed)
Patient ID: Ernest Richmond, male   DOB: [birth year], 28 y.o.   MRN: [id number]  R E S C H E D U L E D     AT      T H E      L A S T       M I N U T E

## 2014-01-07 ENCOUNTER — Ambulatory Visit: Payer: 59 | Admitting: Internal Medicine

## 2014-01-07 NOTE — Progress Notes (Signed)
Patient ID: Ernest Richmond, male   DOB: 06/02/1985, 28 y.o.   MRN: [id number] [name]

## 2014-02-13 ENCOUNTER — Other Ambulatory Visit: Payer: Self-pay | Admitting: Internal Medicine

## 2014-02-18 ENCOUNTER — Encounter: Payer: Self-pay | Admitting: Physician Assistant

## 2014-02-18 ENCOUNTER — Ambulatory Visit (INDEPENDENT_AMBULATORY_CARE_PROVIDER_SITE_OTHER): Payer: 59 | Admitting: Physician Assistant

## 2014-02-18 VITALS — BP 140/84 | HR 74 | Temp 98.6°F | Resp 16 | Ht 72.5 in | Wt 260.0 lb

## 2014-02-18 DIAGNOSIS — F419 Anxiety disorder, unspecified: Secondary | ICD-10-CM

## 2014-02-18 DIAGNOSIS — L719 Rosacea, unspecified: Secondary | ICD-10-CM

## 2014-02-18 DIAGNOSIS — E782 Mixed hyperlipidemia: Secondary | ICD-10-CM

## 2014-02-18 DIAGNOSIS — I10 Essential (primary) hypertension: Secondary | ICD-10-CM

## 2014-02-18 DIAGNOSIS — Z789 Other specified health status: Secondary | ICD-10-CM

## 2014-02-18 DIAGNOSIS — R7309 Other abnormal glucose: Secondary | ICD-10-CM

## 2014-02-18 DIAGNOSIS — E349 Endocrine disorder, unspecified: Secondary | ICD-10-CM

## 2014-02-18 DIAGNOSIS — E559 Vitamin D deficiency, unspecified: Secondary | ICD-10-CM

## 2014-02-18 DIAGNOSIS — F988 Other specified behavioral and emotional disorders with onset usually occurring in childhood and adolescence: Secondary | ICD-10-CM

## 2014-02-18 DIAGNOSIS — Z79899 Other long term (current) drug therapy: Secondary | ICD-10-CM

## 2014-02-18 LAB — CBC WITH DIFFERENTIAL/PLATELET
BASOS ABS: 0 10*3/uL (ref 0.0–0.1)
BASOS PCT: 0 % (ref 0–1)
Eosinophils Absolute: 0.2 10*3/uL (ref 0.0–0.7)
Eosinophils Relative: 3 % (ref 0–5)
HCT: 46.4 % (ref 39.0–52.0)
HEMOGLOBIN: 15.9 g/dL (ref 13.0–17.0)
LYMPHS ABS: 1.1 10*3/uL (ref 0.7–4.0)
Lymphocytes Relative: 18 % (ref 12–46)
MCH: 31.8 pg (ref 26.0–34.0)
MCHC: 34.3 g/dL (ref 30.0–36.0)
MCV: 92.8 fL (ref 78.0–100.0)
MPV: 9.9 fL (ref 8.6–12.4)
Monocytes Absolute: 0.5 10*3/uL (ref 0.1–1.0)
Monocytes Relative: 9 % (ref 3–12)
NEUTROS PCT: 70 % (ref 43–77)
Neutro Abs: 4.2 10*3/uL (ref 1.7–7.7)
Platelets: 183 10*3/uL (ref 150–400)
RBC: 5 MIL/uL (ref 4.22–5.81)
RDW: 13.8 % (ref 11.5–15.5)
WBC: 6 10*3/uL (ref 4.0–10.5)

## 2014-02-18 MED ORDER — BUPROPION HCL ER (XL) 150 MG PO TB24: 150.0000 mg | ORAL_TABLET | ORAL | Status: DC

## 2014-02-18 MED ORDER — TESTOSTERONE CYPIONATE 200 MG/ML IM SOLN: INTRAMUSCULAR | Status: DC

## 2014-02-18 MED ORDER — AMPHETAMINE-DEXTROAMPHETAMINE 20 MG PO TABS: ORAL_TABLET | ORAL | Status: DC

## 2014-02-18 MED ORDER — CITALOPRAM HYDROBROMIDE 40 MG PO TABS: 40.0000 mg | ORAL_TABLET | Freq: Every day | ORAL | Status: DC

## 2014-02-18 MED ORDER — METRONIDAZOLE 0.75 % EX CREA: TOPICAL_CREAM | Freq: Two times a day (BID) | CUTANEOUS | Status: DC

## 2014-02-18 NOTE — Patient Instructions (Addendum)
-Continue medications as prescribed. -Start taking Metronidazole for rosacea for 2 weeks. Please cut back on alcohol use. Please follow recommended diet (low carb, lots of vegetables and white meat) and exercise (120 minutes per week).  Can come back in 3 months for lab only appt if you decide to stop taking testosterone.   Please follow up in 6 months for appt with labs.  Rosacea Rosacea is a long-term (chronic) condition that affects the skin of the face (cheeks, nose, brow, and chin) and sometimes the eyes. Rosacea causes the blood vessels near the surface of the skin to enlarge, resulting in redness. This condition usually begins after age 65. It occurs most often in light-skinned women. Without treatment, rosacea tends to get worse over time. There is no cure for rosacea, but treatment can help control your symptoms. CAUSES  The cause is unknown. It is thought that some people may inherit a tendency to develop rosacea. Certain triggers can make your rosacea worse, including:  Hot baths.  Exercise.  Sunlight.  Very hot or cold temperatures.  Hot or spicy foods and drinks.  Drinking alcohol.  Stress.  Taking blood pressure medicine.  Long-term use of topical steroids on the face. SYMPTOMS   Redness of the face.  Red bumps or pimples on the face.  Red, enlarged nose (rhinophyma).  Blushing easily.  Red lines on the skin.  Irritated or burning feeling in the eyes.  Swollen eyelids. DIAGNOSIS  Your caregiver can usually tell what is wrong by asking about your symptoms and performing a physical exam. TREATMENT  Avoiding triggers is an important part of treatment. You will also need to see a skin specialist (dermatologist) who can develop a treatment plan for you. The goals of treatment are to control your condition and to improve the appearance of your skin. It may take several weeks or months of treatment before you notice an improvement in your skin. Even after your  skin improves, you will likely need to continue treatment to prevent your rosacea from coming back. Treatment methods may include:  Using sunscreen or sunblock daily to protect the skin.  Antibiotic medicine, such as metronidazole, applied directly to the skin.  Antibiotics taken by mouth. This is usually prescribed if you have eye problems from your rosacea.  Laser surgery to improve the appearance of the skin. This surgery can reduce the appearance of red lines on the skin and can remove excess tissue from the nose to reduce its size. HOME CARE INSTRUCTIONS  Avoid things that seem to trigger your flare-ups.  If you are given antibiotics, take them as directed. Finish them even if you start to feel better.  Use a gentle facial cleanser that does not contain alcohol.  You may use a mild facial moisturizer.  Use a sunscreen or sunblock with SPF 30 or greater.  Wear a green-tinted foundation powder to conceal redness, if needed. Choose cosmetics that are noncomedogenic. This means they do not block your pores.  If your eyelids are affected, apply warm compresses to the eyelids. Do this up to 4 times a day or as directed by your caregiver. SEEK MEDICAL CARE IF:  Your skin problems get worse.  You feel depressed.  You lose your appetite.  You have trouble concentrating.  You have problems with your eyes, such as redness or itching. MAKE SURE YOU:  Understand these instructions.  Will watch your condition.  Will get help right away if you are not doing well or get worse.  Document Released: 03/10/2004 Document Revised: 08/02/2011 Document Reviewed: 01/11/2011 [facility] Patient Information 2015 [place], [place]. This information is not intended to replace advice given to you by your health care provider. Make sure you discuss any questions you have with your health care provider.  Testosterone This test is used to determine if your testosterone level is abnormal. This could be  used to explain difficulty getting an erection (erectile dysfunction), inability of your partner to get pregnant (infertility), premature or delayed puberty if you are male, or the appearance of masculine physical features if you are male. PREPARATION FOR TEST A blood sample is obtained by inserting a needle into a vein in the arm. NORMAL FINDINGS  Free Testosterone: 0.3-2 pg/mL  % Free Testosterone: 0.1%-0.3% Total Testosterone:  7 mos-9 yrs (Tanner Stage I)  Male: Less than 30 ng/dL  Male: Less than 30 ng/dL  10-13 yrs (Tanner Stage II)  Male: Less than 300 ng/dL  Male: Less than 40 ng/dL  14-15 yrs (Tanner Stage III)  Male: 170-540 ng/dL  Male: Less than 60 ng/dL  16-19 yrs (Tanner Stage IV, V)  Male: 250-910 ng/dL  Male: Less than 70 ng/dL  20 yrs and over  Male: [phone] ng/dL  Male: Less than 70 ng/dL Ranges for normal findings may vary among different laboratories and hospitals. You should always check with your doctor after having lab work or other tests done to discuss the meaning of your test results and whether your values are considered within normal limits. MEANING OF TEST  Your caregiver will go over the test results with you and discuss the importance and meaning of your results, as well as treatment options and the need for additional tests if necessary. OBTAINING THE TEST RESULTS It is your responsibility to obtain your test results. Ask the lab or department performing the test when and how you will get your results. Document Released: 02/18/2004 Document Revised: 04/25/2011 Document Reviewed: 05/29/2013 [facility] Patient Information 2015 [place], [place]. This information is not intended to replace advice given to you by your health care provider. Make sure you discuss any questions you have with your health care provider.

## 2014-02-18 NOTE — Progress Notes (Addendum)
Assessment and Plan:  1. Essential hypertension Monitor blood pressure at home.  Reminder to go to the ER if any CP, SOB, nausea, dizziness, severe HA, changes vision/speech, left arm numbness and tingling, and jaw pain. - CBC with Differential - BASIC METABOLIC PANEL WITH GFR - Hepatic function panel  2. Hyperlipidemia Please follow recommended diet and exercise.  Check cholesterol level. - Lipid panel  3. Abnormal glucose, screening Please follow recommended diet and exercise.  Check A1C and Insulin levels. - Hemoglobin A1c - Insulin, fasting  4. Vitamin D deficiency Continue vitamin D as prescribed.  Check vitamin D level. - Vit D  25 hydroxy (rtn osteoporosis monitoring)  5. Testosterone deficiency -Continue testosterone as prescribed- testosterone cypionate (DEPO-TESTOSTERONE) 200 MG/ML injection; 3 cc q 2 weeks or as directed  Dispense: 10 mL; Refill: 3.  Check testosterone level. Patient states he wants to recheck in 3 months due to not taking testosterone and trying weight loss and foods that are suppose to help with testosterone. - Testosterone  6. ADD (attention deficit disorder) -Continue Adderall as prescribed. ADD-  Helps with focus, no AE's. The patient was counseled on the addictive nature of the medication and was encouraged to take drug holidays when not needed. - amphetamine-dextroamphetamine (ADDERALL) 20 MG tablet; 1 tablet 3 x daily for ADD  Dispense: 90 tablet; Refill: 0  7. Medication management Will monitor kidney and liver function. - CBC with Differential - BASIC METABOLIC PANEL WITH GFR - Hepatic function panel  8. Rosacea -Please take Metronidazole as prescribed for 2 weeks- metroNIDAZOLE (METROCREAM) 0.75 % cream; Apply topically 2 (two) times daily. For 2 weeks. Avoid contact with eyes  Dispense: 45 g; Refill: 0 -Told patient to cut back on alcohol consumption.  9. Anxiety -Start taking Wellbutrin as prescribed- buPROPion (WELLBUTRIN XL) 150 MG 24  hr tablet; Take 1 tablet (150 mg total) by mouth every morning.  Dispense: 30 tablet; Refill: 0 -Stop taking Xanax due to alcohol use.  Spoke with Vicie Mutters, PA-C and Dr. Melford Aase and they agreed to no longer prescribe Xanax due to alcohol use.  Dr. Melford Aase agreed that he should try Wellbutrin to help with alcohol use and anxiety.    Told patient to use Mineral oil or cooking oil (vegetable oil) to help with dry skin in ears.  Told patient to not use Q-Tips to clean ears. Continue diet and meds as discussed. Further disposition pending results of labs. Discussed medication effects and SE's.  Pt agreed to treatment plan. Please follow up in 1 month for medication check. Please follow up in May 2016 for physical.  HPI A Caucasian 29 y.o. male  presents for 8 month follow up with hypertension, hyperlipidemia, abnormal glucose level and vitamin D.  Patient's last visit was 06/21/13 with labs.  Patient states he drinks 4 beers per day for total of 28 beers per week.  Patient does admit to drinking more on weekends.  Patient states Dr. Melford Aase put him on Xanax to help decrease his alcohol use.  Patient states he takes Xanax as needed.  Patient states he tried Celexa before and had side effects of ED.  Patient states he tried Wellbutrin before, but does not remember if it worked or not.  Patient was taking Xanax 1mg - 1 tablet TID PRN.    His blood pressure has been controlled at home, today their BP is BP: 140/84 mmHg He does not workout, has new year's resolution to loose weight. He denies chest pain, shortness of  breath, dizziness.   He is not on cholesterol medication and denies myalgias. His cholesterol is not at goal. The cholesterol last visit was:   Lab Results  Component Value Date   CHOL 151 06/21/2013   HDL 39* 06/21/2013   LDLCALC 90 06/21/2013   TRIG 111 06/21/2013   CHOLHDL 3.9 06/21/2013   He has not been working on diet and exercise for abnormal glucose level, and denies increased  appetite, polydipsia and polyuria. Last A1C in the office was:  Lab Results  Component Value Date   HGBA1C 5.2 06/21/2013  Number of meals per day? 4   B- breakfast sandwich - [place] sausage and egg white  L-  Grilled chicken sandwiches or fajitas and salad  D- cooks dinner- meat (chicken, steak or pork), vegetables, potatoes, bread  Snacks? Chips and salsa  Beverages?  Water and seltzer   Patient is on Vitamin D supplement. - Multivitamin and Vitamin D- 4,000-5,000  IU units Lab Results  Component Value Date   VD25OH 43 06/21/2013   ADD- Currently taking Adderall 20mg - 1 tablet TID- helps with work and helps him concentrate.  Works for [employer].  Rash- Patient states he notices a rash on his face that has been there for about 2 years.  Patient states he does drink about 4 beers a day.  Testosterone Deficiency- Last testosterone injection was 1 week ago.  Patient states he would like to get off testosterone if possible.  Patient wants to try weight loss and look up foods that are said to help testosterone and would like to recheck testosterone in 3 months.    Current Medications:  Current Outpatient Prescriptions on File Prior to Visit  Medication Sig Dispense Refill  . amphetamine-dextroamphetamine (ADDERALL) 20 MG tablet 1/2 to 1 tablet 2 or 3 x daily for ADD 90 tablet 0  . aspirin EC 81 MG tablet Take 1 tablet (81 mg total) by mouth daily. 150 tablet 2  . citalopram (CELEXA) 40 MG tablet Take 1 tablet (40 mg total) by mouth daily. For mood and anxiety 90 tablet 99  . Multiple Vitamin (MULTIVITAMIN) tablet Take 1 tablet by mouth daily.    Marland Kitchen NEEDLE, DISP, 21 G (YALE DISP NEEDLES 21GX1") 21G X 1" MISC 1 & 1/2 "     21 G needles and 3 cc syringes 25 each 99  . testosterone cypionate (DEPO-TESTOSTERONE) 200 MG/ML injection 3 cc q 2 weeks or as directed 10 mL 3  . cholecalciferol (VITAMIN D) 1000 UNITS tablet Take 1,000 Units by mouth 3 (three) times daily.     No current  facility-administered medications on file prior to visit.   Medical History:  Past Medical History  Diagnosis Date  . Hypertension   . Hypogonadism male   . Vitamin D deficiency    Allergies: No Known Allergies   ROS- Review of Systems  Constitutional: Negative.   HENT: Negative.   Eyes: Negative.   Respiratory: Negative.   Cardiovascular: Negative.   Gastrointestinal: Negative.   Genitourinary: Negative.   Musculoskeletal: Negative.   Skin: Positive for rash.       Patient has butterfly rash on face that started 2 years ago.  Patient denies any symptoms with rash, except for that it is there.  Neurological: Negative.   Psychiatric/Behavioral: The patient is nervous/anxious.    Family history- Review and unchanged Social history- Review and unchanged  Physical Exam: BP 140/84 mmHg  Pulse 74  Temp(Src) 98.6 F (37 C) (Temporal)  Resp 16  Ht 6' 0.5" (1.842 m)  Wt 260 lb (117.935 kg)  BMI 34.76 kg/m2  SpO2 98% Wt Readings from Last 3 Encounters:  02/18/14 260 lb (117.935 kg)  09/05/13 245 lb (111.131 kg)  06/21/13 242 lb 12.8 oz (110.133 kg)  Vitals Reviewed. General Appearance: Well nourished, in no apparent distress. Obese. Eyes: PERRLA, EOMIs, conjunctiva no swelling or erythema.  No scleral icterus. Sinuses: No Frontal/maxillary tenderness ENT/Mouth: Ext aud canals clear, TMs without erythema, edema or bulging. No erythema, swelling, or exudate on posterior pharynx.  Tonsils not swollen or erythematous. Hearing normal.  Neck: Supple, thyroid normal.  Respiratory: Respiratory effort normal, CTAB.  No w/r/r or stridor.  Cardio: RRR with no M/R/Gs. Brisk peripheral pulses without edema.  Abdomen: Soft, + normal BS.  Non tender, no guarding, rebound, hernias, masses. Lymphatics: Non tender without lymphadenopathy.  Musculoskeletal: Full ROM, 5/5 strength, normal gait.  Skin: Warm, dry intact with erythematous butterfly rash on face that is maculopapular and without  pustules and non-pruritic.  No lesions or ecchymosis.  Neuro: Cranial nerves intact. Normal muscle tone, no cerebellar symptoms. Sensation intact.  Psych: Awake and oriented X 3, normal affect, Insight and Judgment appropriate.   Teal Raben, Stephani Police, PA-C 9:15 AM [facility]

## 2014-02-19 LAB — LIPID PANEL
CHOLESTEROL: 203 mg/dL — AB (ref 0–200)
HDL: 46 mg/dL (ref >39)
LDL CALC: 115 mg/dL — AB (ref 0–99)
Total CHOL/HDL Ratio: 4.4 Ratio
Triglycerides: 209 mg/dL — ABNORMAL HIGH (ref <150)
VLDL: 42 mg/dL — ABNORMAL HIGH (ref 0–40)

## 2014-02-19 LAB — HEMOGLOBIN A1C
HEMOGLOBIN A1C: 4.9 % (ref <5.7)
MEAN PLASMA GLUCOSE: 94 mg/dL (ref <117)

## 2014-02-19 LAB — INSULIN, FASTING: Insulin fasting, serum: 6.4 u[IU]/mL (ref 2.0–19.6)

## 2014-02-19 LAB — BASIC METABOLIC PANEL WITH GFR
BUN: 18 mg/dL (ref 6–23)
CALCIUM: 9.3 mg/dL (ref 8.4–10.5)
CO2: 28 meq/L (ref 19–32)
CREATININE: 0.88 mg/dL (ref 0.50–1.35)
Chloride: 105 mEq/L (ref 96–112)
GFR, Est African American: >89 mL/min
GLUCOSE: 80 mg/dL (ref 70–99)
Potassium: 4.3 mEq/L (ref 3.5–5.3)
SODIUM: 139 meq/L (ref 135–145)

## 2014-02-19 LAB — HEPATIC FUNCTION PANEL
ALBUMIN: 4.5 g/dL (ref 3.5–5.2)
ALT: 33 U/L (ref 0–53)
AST: 28 U/L (ref 0–37)
Alkaline Phosphatase: 67 U/L (ref 39–117)
Bilirubin, Direct: 0.2 mg/dL (ref 0.0–0.3)
Indirect Bilirubin: 0.5 mg/dL (ref 0.2–1.2)
TOTAL PROTEIN: 7 g/dL (ref 6.0–8.3)
Total Bilirubin: 0.7 mg/dL (ref 0.2–1.2)

## 2014-02-19 LAB — VITAMIN D 25 HYDROXY (VIT D DEFICIENCY, FRACTURES): Vit D, 25-Hydroxy: 33 ng/mL (ref 30–100)

## 2014-02-19 LAB — TESTOSTERONE: Testosterone: 864 ng/dL (ref 300–890)

## 2014-03-24 ENCOUNTER — Other Ambulatory Visit: Payer: Self-pay | Admitting: *Deleted

## 2014-03-24 DIAGNOSIS — F419 Anxiety disorder, unspecified: Secondary | ICD-10-CM

## 2014-03-24 MED ORDER — BUPROPION HCL ER (XL) 150 MG PO TB24: 150.0000 mg | ORAL_TABLET | ORAL | Status: DC

## 2014-04-11 ENCOUNTER — Ambulatory Visit (INDEPENDENT_AMBULATORY_CARE_PROVIDER_SITE_OTHER): Payer: 59 | Admitting: Physician Assistant

## 2014-04-11 VITALS — BP 130/82 | HR 68 | Temp 97.9°F | Resp 16 | Ht 72.5 in | Wt 252.0 lb

## 2014-04-11 DIAGNOSIS — E349 Endocrine disorder, unspecified: Secondary | ICD-10-CM

## 2014-04-11 DIAGNOSIS — E291 Testicular hypofunction: Secondary | ICD-10-CM

## 2014-04-11 DIAGNOSIS — E669 Obesity, unspecified: Secondary | ICD-10-CM

## 2014-04-11 DIAGNOSIS — F419 Anxiety disorder, unspecified: Secondary | ICD-10-CM

## 2014-04-11 DIAGNOSIS — F988 Other specified behavioral and emotional disorders with onset usually occurring in childhood and adolescence: Secondary | ICD-10-CM

## 2014-04-11 DIAGNOSIS — G4700 Insomnia, unspecified: Secondary | ICD-10-CM

## 2014-04-11 DIAGNOSIS — I10 Essential (primary) hypertension: Secondary | ICD-10-CM

## 2014-04-11 DIAGNOSIS — R0683 Snoring: Secondary | ICD-10-CM

## 2014-04-11 LAB — TESTOSTERONE: TESTOSTERONE: 196 ng/dL — AB (ref 300–890)

## 2014-04-11 MED ORDER — ALPRAZOLAM 1 MG PO TABS: 1.0000 mg | ORAL_TABLET | Freq: Every evening | ORAL | Status: DC | PRN

## 2014-04-11 MED ORDER — AMPHETAMINE-DEXTROAMPHETAMINE 20 MG PO TABS: ORAL_TABLET | ORAL | Status: DC

## 2014-04-11 NOTE — Progress Notes (Signed)
Assessment and Plan: 1. Essential hypertension - continue medications, DASH diet, exercise and monitor at home. Call if greater than 130/80.   2. Testosterone deficiency Natural way to increase testosterone given to patient Check level  3. Anxiety Continue wellbutrin  4. Insomnia Xanax 1mg  #60, take PRN Insomnia- good sleep hygiene discussed, increase day time activity - Ambulatory referral to Sleep Studies  5. Obesity Obesity with co morbidities- long discussion about weight loss, diet, and exercise - Ambulatory referral to Sleep Studies  6. Snoring - Ambulatory referral to Sleep Studies  7. ADD (attention deficit disorder) ADD-  Continue ADD medication, helps with focus, no AE's. The patient was counseled on the addictive nature of the medication and was encouraged to take drug holidays when not needed.  - amphetamine-dextroamphetamine (ADDERALL) 20 MG tablet; 1 tablet 3 x daily for ADD  Dispense: 90 tablet; Refill: 0    HPI 29 y.o.male presents for 1 month follow up. Last visit he was put on wellbutrin but feels that it is not helping and decreases sleep. He was on xanax before and only took 1 mg 1/2. He has stopped testosterone for 7 weeks and is trying to do it naturally.  BMI is Body mass index is 33.69 kg/(m^2)., he is working on diet and exercise. Wt Readings from Last 3 Encounters:  04/11/14 252 lb (114.306 kg)  02/18/14 260 lb (117.935 kg)  09/05/13 245 lb (111.131 kg)    Past Medical History  Diagnosis Date  . Hypertension   . Hypogonadism male   . Vitamin D deficiency      No Known Allergies    Current Outpatient Prescriptions on File Prior to Visit  Medication Sig Dispense Refill  . amphetamine-dextroamphetamine (ADDERALL) 20 MG tablet 1 tablet 3 x daily for ADD 90 tablet 0  . aspirin EC 81 MG tablet Take 1 tablet (81 mg total) by mouth daily. 150 tablet 2  . buPROPion (WELLBUTRIN XL) 150 MG 24 hr tablet Take 1 tablet (150 mg total) by mouth every  morning. 30 tablet 3  . cholecalciferol (VITAMIN D) 1000 UNITS tablet Take 1,000 Units by mouth 3 (three) times daily.    . metroNIDAZOLE (METROCREAM) 0.75 % cream Apply topically 2 (two) times daily. For 2 weeks. Avoid contact with eyes 45 g 0  . Multiple Vitamin (MULTIVITAMIN) tablet Take 1 tablet by mouth daily.    Marland Kitchen NEEDLE, DISP, 21 G (YALE DISP NEEDLES 21GX1") 21G X 1" MISC 1 & 1/2 "     21 G needles and 3 cc syringes 25 each 99  . testosterone cypionate (DEPO-TESTOSTERONE) 200 MG/ML injection 3 cc q 2 weeks or as directed 10 mL 3   No current facility-administered medications on file prior to visit.    ROS: all negative except above.   Physical Exam: Filed Weights   04/11/14 0904  Weight: 252 lb (114.306 kg)   BP 130/82 mmHg  Pulse 68  Temp(Src) 97.9 F (36.6 C)  Resp 16  Ht 6' 0.5" (1.842 m)  Wt 252 lb (114.306 kg)  BMI 33.69 kg/m2 General Appearance: Well nourished, in no apparent distress. Eyes: PERRLA, EOMs, conjunctiva no swelling or erythema Sinuses: No Frontal/maxillary tenderness ENT/Mouth: Ext aud canals clear, TMs without erythema, bulging. No erythema, swelling, or exudate on post pharynx.  Tonsils not swollen or erythematous. Hearing normal.  Neck: Supple, thyroid normal.  Respiratory: Respiratory effort normal, BS equal bilaterally without rales, rhonchi, wheezing or stridor.  Cardio: RRR with no MRGs. Brisk peripheral pulses  without edema.  Abdomen: Soft, + BS.  Non tender, no guarding, rebound, hernias, masses. Lymphatics: Non tender without lymphadenopathy.  Musculoskeletal: Full ROM, 5/5 strength, normal gait.  Skin: Warm, dry without rashes, lesions, ecchymosis.  Neuro: Cranial nerves intact. Normal muscle tone, no cerebellar symptoms. Sensation intact.  Psych: Awake and oriented X 3, normal affect, Insight and Judgment appropriate.     Vicie Mutters, PA-C 9:22 AM [facility]

## 2014-04-11 NOTE — Patient Instructions (Addendum)
Can try melatonin 5mg-15 mg at night for sleep, can also do benadryl 25-50mg at night for sleep.  If this does not help we can try prescription medication.  Also here is some information about good sleep hygiene.   Insomnia Insomnia is frequent trouble falling and/or staying asleep. Insomnia can be a long term problem or a short term problem. Both are common. Insomnia can be a short term problem when the wakefulness is related to a certain stress or worry. Long term insomnia is often related to ongoing stress during waking hours and/or poor sleeping habits. Overtime, sleep deprivation itself can make the problem worse. Every little thing feels more severe because you are overtired and your ability to cope is decreased. CAUSES  Stress, anxiety, and depression. Poor sleeping habits. Distractions such as TV in the bedroom. Naps close to bedtime. Engaging in emotionally charged conversations before bed. Technical reading before sleep. Alcohol and other sedatives. They may make the problem worse. They can hurt normal sleep patterns and normal dream activity. Stimulants such as caffeine for several hours prior to bedtime. Pain syndromes and shortness of breath can cause insomnia. Exercise late at night. Changing time zones may cause sleeping problems (jet lag). It is sometimes helpful to have someone observe your sleeping patterns. They should look for periods of not breathing during the night (sleep apnea). They should also look to see how long those periods last. If you live alone or observers are uncertain, you can also be observed at a sleep clinic where your sleep patterns will be professionally monitored. Sleep apnea requires a checkup and treatment. Give your caregivers your medical history. Give your caregivers observations your family has made about your sleep.  SYMPTOMS  Not feeling rested in the morning. Anxiety and restlessness at bedtime. Difficulty falling and staying asleep. TREATMENT   Your caregiver may prescribe treatment for an underlying medical disorders. Your caregiver can give advice or help if you are using alcohol or other drugs for self-medication. Treatment of underlying problems will usually eliminate insomnia problems. Medications can be prescribed for short time use. They are generally not recommended for lengthy use. Over-the-counter sleep medicines are not recommended for lengthy use. They can be habit forming. You can promote easier sleeping by making lifestyle changes such as: Using relaxation techniques that help with breathing and reduce muscle tension. Exercising earlier in the day. Changing your diet and the time of your last meal. No night time snacks. Establish a regular time to go to bed. Counseling can help with stressful problems and worry. Soothing music and white noise may be helpful if there are background noises you cannot remove. Stop tedious detailed work at least one hour before bedtime. HOME CARE INSTRUCTIONS  Keep a diary. Inform your caregiver about your progress. This includes any medication side effects. See your caregiver regularly. Take note of: Times when you are asleep. Times when you are awake during the night. The quality of your sleep. How you feel the next day. This information will help your caregiver care for you. Get out of bed if you are still awake after 15 minutes. Read or do some quiet activity. Keep the lights down. Wait until you feel sleepy and go back to bed. Keep regular sleeping and waking hours. Avoid naps. Exercise regularly. Avoid distractions at bedtime. Distractions include watching television or engaging in any intense or detailed activity like attempting to balance the household checkbook. Develop a bedtime ritual. Keep a familiar routine of bathing, brushing your teeth,   at bedtime. Distractions include watching television or engaging in any intense or detailed activity like attempting to balance the household checkbook.  Develop a bedtime ritual. Keep a familiar routine of bathing, brushing your teeth, climbing into bed at the same time each night, listening to  soothing music. Routines increase the success of falling to sleep faster.  Use relaxation techniques. This can be using breathing and muscle tension release routines. It can also include visualizing peaceful scenes. You can also help control troubling or intruding thoughts by keeping your mind occupied with boring or repetitive thoughts like the old concept of counting sheep. You can make it more creative like imagining planting one beautiful flower after another in your backyard garden.  During your day, work to eliminate stress. When this is not possible use some of the previous suggestions to help reduce the anxiety that accompanies stressful situations. MAKE SURE YOU:   Understand these instructions.  Will watch your condition.  Will get help right away if you are not doing well or get worse. Document Released: 01/29/2000 Document Revised: 04/25/2011 Document Reviewed: 02/28/2007 [facility] Patient Information 2015 [place], [place]. This information is not intended to replace advice given to you by your health care provider. Make sure you discuss any questions you have with your health care provider.  I think it is possible that you have sleep apnea. It can cause interrupted sleep, headaches, frequent awakenings, fatigue, dry mouth, fast/slow heart beats, memory issues, anxiety/depression, swelling, numbness tingling hands/feet, weight gain, shortness of breath, and the list goes on. Sleep apnea needs to be ruled out because if it is left untreated it does eventually lead to abnormal heart beats, lung failure or heart failure as well as increasing the risk of heart attack and stroke. There are masks you can wear OR a mouth piece that I can give you information about. Often times though people feel MUCH better after getting treatment.   Sleep Apnea  Sleep apnea is a sleep disorder characterized by abnormal pauses in breathing while you sleep. When your breathing pauses, the level of oxygen in your  blood decreases. This causes you to move out of deep sleep and into light sleep. As a result, your quality of sleep is poor, and the system that carries your blood throughout your body (cardiovascular system) experiences stress. If sleep apnea remains untreated, the following conditions can develop:  High blood pressure (hypertension).  Coronary artery disease.  Inability to achieve or maintain an erection (impotence).  Impairment of your thought process (cognitive dysfunction). There are three types of sleep apnea: 1. Obstructive sleep apnea--Pauses in breathing during sleep because of a blocked airway. 2. Central sleep apnea--Pauses in breathing during sleep because the area of the brain that controls your breathing does not send the correct signals to the muscles that control breathing. 3. Mixed sleep apnea--A combination of both obstructive and central sleep apnea.  RISK FACTORS The following risk factors can increase your risk of developing sleep apnea:  Being overweight.  Smoking.  Having narrow passages in your nose and throat.  Being of older age.  Being male.  Alcohol use.  Sedative and tranquilizer use.  Ethnicity. Among individuals younger than 35 years, African Americans are at increased risk of sleep apnea. SYMPTOMS   Difficulty staying asleep.  Daytime sleepiness and fatigue.  Loss of energy.  Irritability.  Loud, heavy snoring.  Morning headaches.  Trouble concentrating.  Forgetfulness.  Decreased interest in sex. DIAGNOSIS  In order to diagnose sleep apnea,  your caregiver will perform a physical examination. Your caregiver may suggest that you take a home sleep test. Your caregiver may also recommend that you spend the night in a sleep lab. In the sleep lab, several monitors record information about your heart, lungs, and brain while you sleep. Your leg and arm movements and blood oxygen level are also recorded. TREATMENT The following actions may  help to resolve mild sleep apnea:  Sleeping on your side.   Using a decongestant if you have nasal congestion.   Avoiding the use of depressants, including alcohol, sedatives, and narcotics.   Losing weight and modifying your diet if you are overweight. There also are devices and treatments to help open your airway:  Oral appliances. These are custom-made mouthpieces that shift your lower jaw forward and slightly open your bite. This opens your airway.  Devices that create positive airway pressure. This positive pressure "splints" your airway open to help you breathe better during sleep. The following devices create positive airway pressure:  Continuous positive airway pressure (CPAP) device. The CPAP device creates a continuous level of air pressure with an air pump. The air is delivered to your airway through a mask while you sleep. This continuous pressure keeps your airway open.  Nasal expiratory positive airway pressure (EPAP) device. The EPAP device creates positive air pressure as you exhale. The device consists of single-use valves, which are inserted into each nostril and held in place by adhesive. The valves create very little resistance when you inhale but create much more resistance when you exhale. That increased resistance creates the positive airway pressure. This positive pressure while you exhale keeps your airway open, making it easier to breath when you inhale again.  Bilevel positive airway pressure (BPAP) device. The BPAP device is used mainly in patients with central sleep apnea. This device is similar to the CPAP device because it also uses an air pump to deliver continuous air pressure through a mask. However, with the BPAP machine, the pressure is set at two different levels. The pressure when you exhale is lower than the pressure when you inhale.  Surgery. Typically, surgery is only done if you cannot comply with less invasive treatments or if the less invasive  treatments do not improve your condition. Surgery involves removing excess tissue in your airway to create a wider passage way. Document Released: 01/21/2002 Document Revised: 05/28/2012 Document Reviewed: 06/09/2011 [facility] Patient Information 2015 [place], [place]. This information is not intended to replace advice given to you by your health care provider. Make sure you discuss any questions you have with your health care provider.  9 Ways to Naturally Increase Testosterone Levels  1.   Lose Weight If you're overweight, shedding the excess pounds may increase your testosterone levels, according to research presented at the Endocrine Society's 2012 meeting. Overweight men are more likely to have low testosterone levels to begin with, so this is an important trick to increase your body's testosterone production when you need it most.  2.   High-Intensity Exercise like Peak Fitness  Short intense exercise has a proven positive effect on increasing testosterone levels and preventing its decline. That's unlike aerobics or prolonged moderate exercise, which have shown to have negative or no effect on testosterone levels. Having a whey protein meal after exercise can further enhance the satiety/testosterone-boosting impact (hunger hormones cause the opposite effect on your testosterone and libido). Here's a summary of what a typical high-intensity Peak Fitness routine might look like: " Warm up for  three minutes  " Exercise as hard and fast as you can for 30 seconds. You should feel like you couldn't possibly go on another few seconds  " Recover at a slow to moderate pace for 90 seconds  " Repeat the high intensity exercise and recovery 7 more times .  3.   Consume Plenty of Zinc The mineral zinc is important for testosterone production, and supplementing your diet for as little as six weeks has been shown to cause a marked improvement in testosterone among men with low levels.1 Likewise, research has  shown that restricting dietary sources of zinc leads to a significant decrease in testosterone, while zinc supplementation increases it2 -- and even protects men from exercised-induced reductions in testosterone levels.3 It's estimated that up to 32 percent of adults over the age of 60 may have lower than recommended zinc intakes; even when dietary supplements were added in, an estimated 20-25 percent of older adults still had inadequate zinc intakes, according to a [employer] and Nutrition Examination Survey.4 Your diet is the best source of zinc; along with protein-rich foods like meats and fish, other good dietary sources of zinc include raw milk, raw cheese, beans, and yogurt or kefir made from raw milk. It can be difficult to obtain enough dietary zinc if you're a vegetarian, and also for meat-eaters as well, largely because of conventional farming methods that rely heavily on chemical fertilizers and pesticides. These chemicals deplete the soil of nutrients ... nutrients like zinc that must be absorbed by plants in order to be passed on to you. In many cases, you may further deplete the nutrients in your food by the way you prepare it. For most food, cooking it will drastically reduce its levels of nutrients like zinc . particularly over-cooking, which many people do. If you decide to use a zinc supplement, stick to a dosage of less than 40 mg a day, as this is the recommended adult upper limit. Taking too much zinc can interfere with your body's ability to absorb other minerals, especially copper, and may cause nausea as a side effect.  4.   Strength Training In addition to Peak Fitness, strength training is also known to boost testosterone levels, provided you are doing so intensely enough. When strength training to boost testosterone, you'll want to increase the weight and lower your number of reps, and then focus on exercises that work a large number of muscles, such as dead lifts or squats.   You can "turbo-charge" your weight training by going slower. By slowing down your movement, you're actually turning it into a high-intensity exercise. Super Slow movement allows your muscle, at the microscopic level, to access the maximum number of cross-bridges between the protein filaments that produce movement in the muscle.   5.   Optimize Your Vitamin D Levels Vitamin D, a steroid hormone, is essential for the healthy development of the nucleus of the sperm cell, and helps maintain semen quality and sperm count. Vitamin D also increases levels of testosterone, which may boost libido. In one study, overweight men who were given vitamin D supplements had a significant increase in testosterone levels after one year.5   6.   Reduce Stress When you're under a lot of stress, your body releases high levels of the stress hormone cortisol. This hormone actually blocks the effects of testosterone,6 presumably because, from a biological standpoint, testosterone-associated behaviors (mating, competing, aggression) may have lowered your chances of survival in an emergency (hence, the "fight or flight"  response is dominant, courtesy of cortisol).  7.   Limit or Eliminate Sugar from Your Diet Testosterone levels decrease after you eat sugar, which is likely because the sugar leads to a high insulin level, another factor leading to low testosterone.7 Based on USDA estimates, the average American consumes 12 teaspoons of sugar a day, which equates to about TWO TONS of sugar during a lifetime.  8.   Eat Healthy Fats By healthy, this means not only mon- and polyunsaturated fats, like that found in avocadoes and nuts, but also saturated, as these are essential for building testosterone. Research shows that a diet with less than 40 percent of energy as fat (and that mainly from animal sources, i.e. saturated) lead to a decrease in testosterone levels.8 My personal diet is about 60-70 percent healthy fat, and other  experts agree that the ideal diet includes somewhere between 50-70 percent fat.  It's important to understand that your body requires saturated fats from animal and vegetable sources (such as meat, dairy, certain oils, and tropical plants like coconut) for optimal functioning, and if you neglect this important food group in favor of sugar, grains and other starchy carbs, your health and weight are almost guaranteed to suffer. Examples of healthy fats you can eat more of to give your testosterone levels a boost include: Olives and Olive oil  Coconuts and coconut oil Butter made from raw grass-fed organic milk Raw nuts, such as, almonds or pecans Organic pastured egg yolks Avocados Grass-fed meats Palm oil Unheated organic nut oils   9.   Boost Your Intake of Branch Chain Amino Acids (BCAA) from Foods Like [place] suggests that BCAAs result in higher testosterone levels, particularly when taken along with resistance training.9 While BCAAs are available in supplement form, you'll find the highest concentrations of BCAAs like leucine in dairy products - especially quality cheeses and whey protein. Even when getting leucine from your natural food supply, it's often wasted or used as a building block instead of an anabolic agent. So to create the correct anabolic environment, you need to boost leucine consumption way beyond mere maintenance levels. That said, keep in mind that using leucine as a free form amino acid can be highly counterproductive as when free form amino acids are artificially administrated, they rapidly enter your circulation while disrupting insulin function, and impairing your body's glycemic control. Food-based leucine is really the ideal form that can benefit your muscles without side effects.   Before you even begin to attack a weight-loss plan, it pays to remember this: You are not fat. You have fat. Losing weight isn't about blame or shame; it's simply another achievement  to accomplish. Dieting is like any other skill-you have to buckle down and work at it. As long as you act in a smart, reasonable way, you'll ultimately get where you want to be. Here are some weight loss pearls for you.  1. It's Not a Diet. It's a Lifestyle Thinking of a diet as something you're on and suffering through only for the short term doesn't work. To shed weight and keep it off, you need to make permanent changes to the way you eat. It's OK to indulge occasionally, of course, but if you cut calories temporarily and then revert to your old way of eating, you'll gain back the weight quicker than you can say yo-yo. Use it to lose it. Research shows that one of the best predictors of long-term weight loss is how many pounds you drop  in the first month. For that reason, nutritionists often suggest being stricter for the first two weeks of your new eating strategy to build momentum. Cut out added sugar and alcohol and avoid unrefined carbs. After that, figure out how you can reincorporate them in a way that's healthy and maintainable.  2. There's a Right Way to Exercise Working out burns calories and fat and boosts your metabolism by building muscle. But those trying to lose weight are notorious for overestimating the number of calories they burn and underestimating the amount they take in. Unfortunately, your system is biologically programmed to hold on to extra pounds and that means when you start exercising, your body senses the deficit and ramps up its hunger signals. If you're not diligent, you'll eat everything you burn and then some. Use it to lose it. Cardio gets all the exercise glory, but strength and interval training are the real heroes. They help you build lean muscle, which in turn increases your metabolism and calorie-burning ability 3. Don't Overreact to Mild Hunger Some people have a hard time losing weight because of hunger anxiety. To them, being hungry is bad-something to be avoided  at all costs-so they carry snacks with them and eat when they don't need to. Others eat because they're stressed out or bored. While you never want to get to the point of being ravenous (that's when bingeing is likely to happen), a hunger pang, a craving, or the fact that it's 3:00 p.m. should not send you racing for the vending machine or obsessing about the energy bar in your purse. Ideally, you should put off eating until your stomach is growling and it's difficult to concentrate.  Use it to lose it. When you feel the urge to eat, use the HALT method. Ask yourself, Am I really hungry? Or am I angry or anxious, lonely or bored, or tired? If you're still not certain, try the apple test. If you're truly hungry, an apple should seem delicious; if it doesn't, something else is going on. Or you can try drinking water and making yourself busy, if you are still hungry try a healthy snack.  4. Not All Calories Are Created Equal The mechanics of weight loss are pretty simple: Take in fewer calories than you use for energy. But the kind of food you eat makes all the difference. Processed food that's high in saturated fat and refined starch or sugar can cause inflammation that disrupts the hormone signals that tell your brain you're full. The result: You eat a lot more.  Use it to lose it. Clean up your diet. Swap in whole, unprocessed foods, including vegetables, lean protein, and healthy fats that will fill you up and give you the biggest nutritional bang for your calorie buck. In a few weeks, as your brain starts receiving regular hunger and fullness signals once again, you'll notice that you feel less hungry overall and naturally start cutting back on the amount you eat.  5. Protein, Produce, and Plant-Based Fats Are Your Weight-Loss Trinity Here's why eating the three Ps regularly will help you drop pounds. Protein fills you up. You need it to build lean muscle, which keeps your metabolism humming so that you can  torch more fat. People in a weight-loss program who ate double the recommended daily allowance for protein (about 110 grams for a 150-pound woman) lost 70 percent of their weight from fat, while people who ate the RDA lost only about 40 percent, one study found. Produce is  packed with filling fiber. "It's very difficult to consume too many calories if you're eating a lot of vegetables. Example: Three cups of broccoli is a lot of food, yet only 93 calories. (Fruit is another story. It can be easy to overeat and can contain a lot of calories from sugar, so be sure to monitor your intake.) Plant-based fats like olive oil and those in avocados and nuts are healthy and extra satiating.  Use it to lose it. Aim to incorporate each of the three Ps into every meal and snack. People who eat protein throughout the day are able to keep weight off, according to a study in the [place] of Clinical Nutrition. In addition to meat, poultry and seafood, good sources are beans, lentils, eggs, tofu, and yogurt. As for fat, keep portion sizes in check by measuring out salad dressing, oil, and nut butters (shoot for one to two tablespoons). Finally, eat veggies or a little fruit at every meal. People who did that consumed 308 fewer calories but didn't feel any hungrier than when they didn't eat more produce.  7. How You Eat Is As Important As What You Eat In order for your brain to register that you're full, you need to focus on what you're eating. Sit down whenever you eat, preferably at a table. Turn off the TV or computer, put down your phone, and look at your food. Smell it. Chew slowly, and don't put another bite on your fork until you swallow. When women ate lunch this attentively, they consumed 30 percent less when snacking later than those who listened to an audiobook at lunchtime, according to a study in the [place] of Nutrition. 8. Weighing Yourself Really Works The scale provides the best evidence about  whether your efforts are paying off. Seeing the numbers tick up or down or stagnate is motivation to keep going-or to rethink your approach. A 2015 study at [facility] found that daily weigh-ins helped people lose more weight, keep it off, and maintain that loss, even after two years. Use it to lose it. Step on the scale at the same time every day for the best results. If your weight shoots up several pounds from one weigh-in to the next, don't freak out. Eating a lot of salt the night before or having your period is the likely culprit. The number should return to normal in a day or two. It's a steady climb that you need to do something about. 9. Too Much Stress and Too Little Sleep Are Your Enemies When you're tired and frazzled, your body cranks up the production of cortisol, the stress hormone that can cause carb cravings. Not getting enough sleep also boosts your levels of ghrelin, a hormone associated with hunger, while suppressing leptin, a hormone that signals fullness and satiety. People on a diet who slept only five and a half hours a night for two weeks lost 55 percent less fat and were hungrier than those who slept eight and a half hours, according to a study in the [place]. Use it to lose it. Prioritize sleep, aiming for seven hours or more a night, which research shows helps lower stress. And make sure you're getting quality zzz's. If a snoring spouse or a fidgety cat wakes you up frequently throughout the night, you may end up getting the equivalent of just four hours of sleep, according to a study from [facility]. Keep pets out of the bedroom, and use a white-noise  app to drown out snoring. 10. You Will Hit a plateau-And You Can Bust Through It As you slim down, your body releases much less leptin, the fullness hormone.  If you're not strength training, start right now. Building muscle can raise your metabolism to help you overcome a plateau. To keep  your body challenged and burning calories, incorporate new moves and more intense intervals into your workouts or add another sweat session to your weekly routine. Alternatively, cut an extra 100 calories or so a day from your diet. Now that you've lost weight, your body simply doesn't need as much fuel.

## 2014-05-13 ENCOUNTER — Telehealth: Payer: Self-pay

## 2014-05-13 NOTE — Telephone Encounter (Signed)
LMOM for patient with Apnea Link results, results were normal range, no need for CPAP study, per Estill Bamberg if patient is still experiencing sleep problems we can proceed with sleep study. Advised patient to call with any questions or concerns

## 2014-05-21 ENCOUNTER — Other Ambulatory Visit: Payer: Self-pay | Admitting: Internal Medicine

## 2014-05-21 DIAGNOSIS — F988 Other specified behavioral and emotional disorders with onset usually occurring in childhood and adolescence: Secondary | ICD-10-CM

## 2014-05-21 MED ORDER — AMPHETAMINE-DEXTROAMPHETAMINE 20 MG PO TABS: ORAL_TABLET | ORAL | Status: DC

## 2014-05-31 ENCOUNTER — Encounter: Payer: Self-pay | Admitting: *Deleted

## 2014-06-23 ENCOUNTER — Ambulatory Visit (INDEPENDENT_AMBULATORY_CARE_PROVIDER_SITE_OTHER): Payer: 59 | Admitting: Internal Medicine

## 2014-06-23 ENCOUNTER — Encounter: Payer: Self-pay | Admitting: Internal Medicine

## 2014-06-23 VITALS — BP 126/84 | HR 80 | Temp 97.5°F | Resp 16 | Ht 72.0 in | Wt 248.4 lb

## 2014-06-23 DIAGNOSIS — E782 Mixed hyperlipidemia: Secondary | ICD-10-CM

## 2014-06-23 DIAGNOSIS — M549 Dorsalgia, unspecified: Secondary | ICD-10-CM

## 2014-06-23 DIAGNOSIS — E291 Testicular hypofunction: Secondary | ICD-10-CM

## 2014-06-23 DIAGNOSIS — E559 Vitamin D deficiency, unspecified: Secondary | ICD-10-CM

## 2014-06-23 DIAGNOSIS — E349 Endocrine disorder, unspecified: Secondary | ICD-10-CM

## 2014-06-23 DIAGNOSIS — F988 Other specified behavioral and emotional disorders with onset usually occurring in childhood and adolescence: Secondary | ICD-10-CM

## 2014-06-23 DIAGNOSIS — R7309 Other abnormal glucose: Secondary | ICD-10-CM

## 2014-06-23 DIAGNOSIS — I10 Essential (primary) hypertension: Secondary | ICD-10-CM

## 2014-06-23 DIAGNOSIS — Z111 Encounter for screening for respiratory tuberculosis: Secondary | ICD-10-CM

## 2014-06-23 DIAGNOSIS — R5383 Other fatigue: Secondary | ICD-10-CM

## 2014-06-23 DIAGNOSIS — F909 Attention-deficit hyperactivity disorder, unspecified type: Secondary | ICD-10-CM

## 2014-06-23 DIAGNOSIS — Z79899 Other long term (current) drug therapy: Secondary | ICD-10-CM

## 2014-06-23 DIAGNOSIS — Z1212 Encounter for screening for malignant neoplasm of rectum: Secondary | ICD-10-CM

## 2014-06-23 LAB — CBC WITH DIFFERENTIAL/PLATELET
BASOS ABS: 0 10*3/uL (ref 0.0–0.1)
Basophils Relative: 0 % (ref 0–1)
Eosinophils Absolute: 0.1 10*3/uL (ref 0.0–0.7)
Eosinophils Relative: 2 % (ref 0–5)
HCT: 43.2 % (ref 39.0–52.0)
Hemoglobin: 14.5 g/dL (ref 13.0–17.0)
Lymphocytes Relative: 24 % (ref 12–46)
Lymphs Abs: 1.4 10*3/uL (ref 0.7–4.0)
MCH: 31.1 pg (ref 26.0–34.0)
MCHC: 33.6 g/dL (ref 30.0–36.0)
MCV: 92.7 fL (ref 78.0–100.0)
MPV: 10.2 fL (ref 8.6–12.4)
Monocytes Absolute: 0.4 10*3/uL (ref 0.1–1.0)
Monocytes Relative: 7 % (ref 3–12)
NEUTROS ABS: 4 10*3/uL (ref 1.7–7.7)
Neutrophils Relative %: 67 % (ref 43–77)
PLATELETS: 172 10*3/uL (ref 150–400)
RBC: 4.66 MIL/uL (ref 4.22–5.81)
RDW: 13.9 % (ref 11.5–15.5)
WBC: 6 10*3/uL (ref 4.0–10.5)

## 2014-06-23 LAB — IRON AND TIBC
%SAT: 39 % (ref 20–55)
Iron: 120 ug/dL (ref 42–165)
TIBC: 307 ug/dL (ref 215–435)
UIBC: 187 ug/dL (ref 125–400)

## 2014-06-23 LAB — HEPATIC FUNCTION PANEL
ALT: 70 U/L — AB (ref 0–53)
AST: 46 U/L — AB (ref 0–37)
Albumin: 4.5 g/dL (ref 3.5–5.2)
Alkaline Phosphatase: 70 U/L (ref 39–117)
BILIRUBIN INDIRECT: 0.4 mg/dL (ref 0.2–1.2)
Bilirubin, Direct: 0.1 mg/dL (ref 0.0–0.3)
TOTAL PROTEIN: 7.2 g/dL (ref 6.0–8.3)
Total Bilirubin: 0.5 mg/dL (ref 0.2–1.2)

## 2014-06-23 LAB — TSH: TSH: 1.658 u[IU]/mL (ref 0.350–4.500)

## 2014-06-23 LAB — MAGNESIUM: Magnesium: 1.7 mg/dL (ref 1.5–2.5)

## 2014-06-23 LAB — BASIC METABOLIC PANEL WITH GFR
BUN: 17 mg/dL (ref 6–23)
CHLORIDE: 104 meq/L (ref 96–112)
CO2: 27 mEq/L (ref 19–32)
CREATININE: 0.93 mg/dL (ref 0.50–1.35)
Calcium: 9.7 mg/dL (ref 8.4–10.5)
GFR, Est Non African American: >89 mL/min
Glucose, Bld: 84 mg/dL (ref 70–99)
POTASSIUM: 4.1 meq/L (ref 3.5–5.3)
Sodium: 139 mEq/L (ref 135–145)

## 2014-06-23 LAB — LIPID PANEL
Cholesterol: 186 mg/dL (ref 0–200)
HDL: 52 mg/dL (ref >=40)
LDL Cholesterol: 102 mg/dL — ABNORMAL HIGH (ref 0–99)
Total CHOL/HDL Ratio: 3.6 Ratio
Triglycerides: 158 mg/dL — ABNORMAL HIGH (ref <150)
VLDL: 32 mg/dL (ref 0–40)

## 2014-06-23 LAB — HEMOGLOBIN A1C
Hgb A1c MFr Bld: 4.8 % (ref <5.7)
Mean Plasma Glucose: 91 mg/dL (ref <117)

## 2014-06-23 LAB — VITAMIN B12: Vitamin B-12: 423 pg/mL (ref 211–911)

## 2014-06-23 MED ORDER — AMPHETAMINE-DEXTROAMPHETAMINE 20 MG PO TABS: ORAL_TABLET | ORAL | Status: DC

## 2014-06-23 MED ORDER — MELOXICAM 15 MG PO TABS: ORAL_TABLET | ORAL | Status: AC

## 2014-06-23 MED ORDER — PREDNISONE 20 MG PO TABS: ORAL_TABLET | ORAL | Status: DC

## 2014-06-23 MED ORDER — CYCLOBENZAPRINE HCL 10 MG PO TABS: ORAL_TABLET | ORAL | Status: AC

## 2014-06-23 NOTE — Progress Notes (Signed)
Patient ID: Ernest Richmond, male   DOB: 1985/11/16, 29 y.o.   MRN: [id number]   Annual Comprehensive Examination  This very nice 29 y.o. SWM presents for complete physical.  Patient has been followed for hx/o elevated BP,  Morbid Obesity and screening for Prediabetes, Hyperlipidemia, Testosterone and Vitamin D Deficiency.   Patient has hx/o labile elevated BP since 2011 and has been monitored expectantly. Patient's BP has been controlled at home.Today's BP: 126/84 mmHg. Patient denies any cardiac symptoms as chest pain, palpitations, shortness of breath, dizziness or ankle swelling.   Patient's hyperlipidemia is not controlled with diet. Patient denies myalgias or other medication SE's. Last lipids were not at goal - Total Chol 203; HDL 46; elevated LDL  115; and with elevated Trig 209 on   02/18/2014   Patient has Morbid Obesity (BMI 33.68) and is screened expectantly for prediabetes and patient denies reactive hypoglycemic symptoms, visual blurring, diabetic polys or paresthesias. Last A1c was  4.9% on 02/18/2014.     Patient has hx/o Testosterone Deficiency with low level 192 in Dec 2010 and had been on Testosterone injections until Dec 2015 , when he discontinued Testosterone. He feels he is performing well with adequate libido and erectile functioning. He does report some fatigue that he attributes to being off of treatment. Finally, patient has history of Vitamin D Deficiency of  "8" in 2010 and last vitamin D was still very low at  33 on 02/18/2014.      Medication Sig  . ALPRAZolam (XANAX) 1 MG tablet Take 1 tablet (1 mg total) by mouth at bedtime as needed for sleep.  Marland Kitchen buPROPion - XL) 150 MG 24 hr tablet Take 1 tablet (150 mg total) by mouth every morning.  Marland Kitchen VITAMIN D 1000 UNITS tablet Take 1,000 Units by mouth 3 (three) times daily.  Marland Kitchen METROCREAM 0.75 % cream Apply topically 2 (two) times daily. For 2 weeks. Avoid contact with eyes  . Multiple Vitamin (MULTIVITAMIN) tablet Take 1 tablet by mouth  daily.  Marland Kitchen DEPO-TESTOSTERON) 200 MG/ML inj 3 cc q 2 weeks or as directed (Patient not taking: Reported on 06/23/2014)  . amphetamine (ADDERALL) 20 MG tablet 1 tablet 3 x daily for ADD   No Known Allergies   Past Medical History  Diagnosis Date  . Hypertension   . Hypogonadism male   . Vitamin D deficiency    Health Maintenance  Topic Date Due  . HIV Screening  09/26/1998  . INFLUENZA VACCINE  09/15/2014  . TETANUS/TDAP  01/15/2019   Immunization History  Administered Date(s) Administered  . Influenza Whole 01/18/2010  . PPD Test 06/21/2013  . Pneumococcal Polysaccharide-23 01/18/2010  . Tdap 01/14/2009   Past Surgical History  Procedure Laterality Date  . Wisdom tooth extraction    . Knee arthroscopy Left    Family History  Problem Relation Age of Onset  . Arthritis Mother   . Hyperlipidemia Father   . Hypertension Father   . Diabetes Father   . Gout Father    History   Social History  . Marital Status: Single    Spouse Name: N/A  . Number of Children: N/A  . Years of Education: N/A   Occupational History  . 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    Social History Main Topics  . Smoking status: Former Smoker -- 0.00 packs/day    Quit date: 03/17/2012  . Smokeless tobacco: Not on file  . Alcohol Use: 10.0 oz/week    20 drink(s) per week  Comment: beer  . Drug Use: No  . Sexual Activity: Normal & active    ROS Constitutional: Denies fever, chills, weight loss/gain, headaches, insomnia,  night sweats or change in appetite. Does c/o fatigue. Eyes: Denies redness, blurred vision, diplopia, discharge, itchy or watery eyes.  ENT: Denies discharge, congestion, post nasal drip, epistaxis, sore throat, earache, hearing loss, dental pain, Tinnitus, Vertigo, Sinus pain or snoring.  Cardio: Denies chest pain, palpitations, irregular heartbeat, syncope, dyspnea, diaphoresis, orthopnea, PND, claudication or edema Respiratory: denies cough, dyspnea, DOE, pleurisy,  hoarseness, laryngitis or wheezing.  Gastrointestinal: Denies dysphagia, heartburn, reflux, water brash, pain, cramps, nausea, vomiting, bloating, diarrhea, constipation, hematemesis, melena, hematochezia, jaundice or hemorrhoids Genitourinary: Denies dysuria, frequency, urgency, nocturia, hesitancy, discharge, hematuria or flank pain Musculoskeletal: Denies arthralgia, myalgia, stiffness, Jt. Swelling, pain, limp or strain/sprain. Denies Falls. Skin: Denies puritis, rash, hives, warts, acne, eczema or change in skin lesion Neuro: No weakness, tremor, incoordination, spasms, paresthesia or pain Psychiatric: Denies confusion, memory loss or sensory loss. Denies Depression. Endocrine: Denies change in weight, skin, hair change, nocturia, and paresthesia, diabetic polys, visual blurring or hyper / hypo glycemic episodes.  Heme/Lymph: No excessive bleeding, bruising or enlarged lymph nodes.  Physical Exam  BP 126/84   Pulse 80  Temp(Src) 97.5 F   Resp 16  Ht 6'  Wt 248 lb 6.4 oz     BMI 33.68   General Appearance: Well nourished, in no apparent distress. Eyes: PERRLA, EOMs, conjunctiva no swelling or erythema, normal fundi and vessels. Sinuses: No frontal/maxillary tenderness ENT/Mouth: EACs patent / TMs  nl. Nares clear without erythema, swelling, mucoid exudates. Oral hygiene is good. No erythema, swelling, or exudate. Tongue normal, non-obstructing. Tonsils not swollen or erythematous. Hearing normal.  Neck: Supple, thyroid normal. No bruits, nodes or JVD. Respiratory: Respiratory effort normal.  BS equal and clear bilateral without rales, rhonci, wheezing or stridor. Cardio: Heart sounds are normal with regular rate and rhythm and no murmurs, rubs or gallops. Peripheral pulses are normal and equal bilaterally without edema. No aortic or femoral bruits. Chest: symmetric with normal excursions and percussion.  Abdomen: Flat, soft, with bowel sounds. Nontender, no guarding, rebound,  hernias, masses, or organomegaly.  Lymphatics: Non tender without lymphadenopathy.  Genitourinary: No hernias.Testes nl. DRE - prostate nl for age - smooth & firm w/o nodules. Musculoskeletal: Full ROM all peripheral extremities, joint stability, 5/5 strength, and normal gait. Skin: Warm and dry without rashes, lesions, cyanosis, clubbing or  ecchymosis.  Neuro: Cranial nerves intact, reflexes equal bilaterally. Normal muscle tone, no cerebellar symptoms. Sensation intact.  Pysch: Awake and oriented X 3 with normal affect, insight and judgment appropriate.   Assessment and Plan  1. Essential hypertension  - Microalbumin / creatinine urine ratio - EKG 12-Lead  2. Hyperlipidemia  - Lipid panel  3. Abnormal glucose, screening  - Hemoglobin A1c - Insulin, random  4. Vitamin D deficiency  - Vit D  25 hydroxy (rtn osteoporosis monitoring)  5. Testosterone deficiency   6. ADD (attention deficit disorder)  - amphetamine-dextroamphetamine (ADDERALL) 20 MG tablet; 1 tablet 3 x daily for ADD  Dispense: 90 tablet; Refill: 0  7. Medication management  - Urine Microscopic - CBC with Differential/Platelet - BASIC METABOLIC PANEL WITH GFR - Hepatic function panel - Magnesium  8. Screening for rectal cancer  - POC Hemoccult Bld/Stl (3-Cd Home Screen); Future  9. Other fatigue  - Vitamin B12 - Testosterone - Iron and TIBC - TSH   Continue prudent diet as discussed,  weight control, BP monitoring, regular exercise, and medications as discussed.  Discussed med effects and SE's. Routine screening labs and tests as requested with regular follow-up as recommended. Over 40 minutes of exam, counseling &  chart review was performed

## 2014-06-23 NOTE — Patient Instructions (Signed)
Recommend Low dose or baby Aspirin 81 mg daily   To reduce risk of Colon Cancer 20 %, Skin Cancer 26 % , Melanoma 46% and   Pancreatic cancer 60%  ++++++++++++++++++ Vitamin D goal is between 70-100.   Please make sure that you are taking your Vitamin D as directed.   It is very important as a natural antiinflammatory   helping hair, skin, and nails, as well as reducing stroke and heart attack risk.   It helps your bones and helps with mood.  It also decreases numerous cancer risks so please take it as directed.   Low Vit D is associated with a 200-300% higher risk for CANCER   and 200-300% higher risk for HEART   ATTACK  &  STROKE.    .....................................Marland Kitchen  It is also associated with higher death rate at younger ages,   autoimmune diseases like Rheumatoid arthritis, Lupus, Multiple Sclerosis.     Also many other serious conditions, like depression, Alzheimer's  Dementia, infertility, muscle aches, fatigue, fibromyalgia - just to name a few.  +++++++++++++++++++  Recommend the book "The END of DIETING" by Dr Excell Seltzer   & the book "The END of DIABETES " by Dr Excell Seltzer  At [email or web address] - get book & Audio CD's     Being diabetic has a  300% increased risk for heart attack, stroke, cancer, and alzheimer- type vascular dementia. It is very important that you work harder with diet by avoiding all foods that are white. Avoid white rice (brown & wild rice is OK), white potatoes (sweetpotatoes in moderation is OK), White bread or wheat bread or anything made out of white flour like bagels, donuts, rolls, buns, biscuits, cakes, pastries, cookies, pizza crust, and pasta (made from white flour & egg whites) - vegetarian pasta or spinach or wheat pasta is OK. Multigrain breads like Arnold's or Pepperidge Farm, or multigrain sandwich thins or flatbreads.  Diet, exercise and weight loss can reverse and cure diabetes in the early stages.  Diet, exercise and weight loss  is very important in the control and prevention of complications of diabetes which affects every system in your body, ie. Brain - dementia/stroke, eyes - glaucoma/blindness, heart - heart attack/heart failure, kidneys - dialysis, stomach - gastric paralysis, intestines - malabsorption, nerves - severe painful neuritis, circulation - gangrene & loss of a leg(s), and finally cancer and Alzheimers.    I recommend avoid fried & greasy foods,  sweets/candy, white rice (brown or wild rice or Quinoa is OK), white potatoes (sweet potatoes are OK) - anything made from white flour - bagels, doughnuts, rolls, buns, biscuits,white and wheat breads, pizza crust and traditional pasta made of white flour & egg white(vegetarian pasta or spinach or wheat pasta is OK).  Multi-grain bread is OK - like multi-grain flat bread or sandwich thins. Avoid alcohol in excess. Exercise is also important.    Eat all the vegetables you want - avoid meat, especially red meat and dairy - especially cheese.  Cheese is the most concentrated form of trans-fats which is the worst thing to clog up our arteries. Veggie cheese is OK which can be found in the fresh produce section at [facility] or [employer] or [employer]  ++++++++++++++++++++++++++  Preventive Care for Adults  A healthy lifestyle and preventive care can promote health and wellness. Preventive health guidelines for men include the following key practices:  A routine yearly physical is a good way to check with your health care provider about  your health and preventative screening. It is a chance to share any concerns and updates on your health and to receive a thorough exam.  Visit your dentist for a routine exam and preventative care every 6 months. Brush your teeth twice a day and floss once a day. Good oral hygiene prevents tooth decay and gum disease.  The frequency of eye exams is based on your age, health, family medical history, use of contact lenses, and other  factors. Follow your health care provider's recommendations for frequency of eye exams.  Eat a healthy diet. Foods such as vegetables, fruits, whole grains, low-fat dairy products, and lean protein foods contain the nutrients you need without too many calories. Decrease your intake of foods high in solid fats, added sugars, and salt. Eat the right amount of calories for you.Get information about a proper diet from your health care provider, if necessary.  Regular physical exercise is one of the most important things you can do for your health. Most adults should get at least 150 minutes of moderate-intensity exercise (any activity that increases your heart rate and causes you to sweat) each week. In addition, most adults need muscle-strengthening exercises on 2 or more days a week.  Maintain a healthy weight. The body mass index (BMI) is a screening tool to identify possible weight problems. It provides an estimate of body fat based on height and weight. Your health care provider can find your BMI and can help you achieve or maintain a healthy weight.For adults 20 years and older:  A BMI below 18.5 is considered underweight.  A BMI of 18.5 to 24.9 is normal.  A BMI of 25 to 29.9 is considered overweight.  A BMI of 30 and above is considered obese.  Maintain normal blood lipids and cholesterol levels by exercising and minimizing your intake of saturated fat. Eat a balanced diet with plenty of fruit and vegetables. Blood tests for lipids and cholesterol should begin at age 65 and be repeated every 5 years. If your lipid or cholesterol levels are high, you are over 50, or you are at high risk for heart disease, you may need your cholesterol levels checked more frequently.Ongoing high lipid and cholesterol levels should be treated with medicines if diet and exercise are not working.  If you smoke, find out from your health care provider how to quit. If you do not use tobacco, do not start.  Lung  cancer screening is recommended for adults aged 21-80 years who are at high risk for developing lung cancer because of a history of smoking. A yearly low-dose CT scan of the lungs is recommended for people who have at least a 30-pack-year history of smoking and are a current smoker or have quit within the past 15 years. A pack year of smoking is smoking an average of 1 pack of cigarettes a day for 1 year (for example: 1 pack a day for 30 years or 2 packs a day for 15 years). Yearly screening should continue until the smoker has stopped smoking for at least 15 years. Yearly screening should be stopped for people who develop a health problem that would prevent them from having lung cancer treatment.  If you choose to drink alcohol, do not have more than 2 drinks per day. One drink is considered to be 12 ounces (355 mL) of beer, 5 ounces (148 mL) of wine, or 1.5 ounces (44 mL) of liquor.  High blood pressure causes heart disease and increases the risk of  stroke. Your blood pressure should be checked. Ongoing high blood pressure should be treated with medicines, if weight loss and exercise are not effective.  If you are 80-39 years old, ask your health care provider if you should take aspirin to prevent heart disease.  Diabetes screening involves taking a blood sample to check your fasting blood sugar level. Testing should be considered at a younger age or be carried out more frequently if you are overweight and have at least 1 risk factor for diabetes.  Colorectal cancer can be detected and often prevented. Most routine colorectal cancer screening begins at the age of 31 and continues through age 17. However, your health care provider may recommend screening at an earlier age if you have risk factors for colon cancer. On a yearly basis, your health care provider may provide home test kits to check for hidden blood in the stool. Use of a small camera at the end of a tube to directly examine the colon  (sigmoidoscopy or colonoscopy) can detect the earliest forms of colorectal cancer. Talk to your health care provider about this at age 39, when routine screening begins. Direct exam of the colon should be repeated every 5-10 years through age 55, unless early forms of precancerous polyps or small growths are found.  Screening for abdominal aortic aneurysm (AAA)  are recommended for persons over age 66 who have history of hypertensionor who are current or former smokers.  Talk with your health care provider about prostate cancer screening.  Testicular cancer screening is recommended for adult males. Screening includes self-exam, a health care provider exam, and other screening tests. Consult with your health care provider about any symptoms you have or any concerns you have about testicular cancer.  Use sunscreen. Apply sunscreen liberally and repeatedly throughout the day. You should seek shade when your shadow is shorter than you. Protect yourself by wearing long sleeves, pants, a wide-brimmed hat, and sunglasses year round, whenever you are outdoors.  Once a month, do a whole-body skin exam, using a mirror to look at the skin on your back. Tell your health care provider about new moles, moles that have irregular borders, moles that are larger than a pencil eraser, or moles that have changed in shape or color.  Stay current with required vaccines (immunizations).  Influenza vaccine. All adults should be immunized every year.  Tetanus, diphtheria, and acellular pertussis (Td, Tdap) vaccine. An adult who has not previously received Tdap or who does not know his vaccine status should receive 1 dose of Tdap. This initial dose should be followed by tetanus and diphtheria toxoids (Td) booster doses every 10 years. Adults with an unknown or incomplete history of completing a 3-dose immunization series with Td-containing vaccines should begin or complete a primary immunization series including a Tdap dose.  Adults should receive a Td booster every 10 years.  Zoster vaccine. One dose is recommended for adults aged 39 years or older unless certain conditions are present.    Pneumococcal 13-valent conjugate (PCV13) vaccine. When indicated, a person who is uncertain of his immunization history and has no record of immunization should receive the PCV13 vaccine. An adult aged 70 years or older who has certain medical conditions and has not been previously immunized should receive 1 dose of PCV13 vaccine. This PCV13 should be followed with a dose of pneumococcal polysaccharide (PPSV23) vaccine. The PPSV23 vaccine dose should be obtained at least 8 weeks after the dose of PCV13 vaccine. An adult aged 78 years  or older who has certain medical conditions and previously received 1 or more doses of PPSV23 vaccine should receive 1 dose of PCV13. The PCV13 vaccine dose should be obtained 1 or more years after the last PPSV23 vaccine dose.    Pneumococcal polysaccharide (PPSV23) vaccine. When PCV13 is also indicated, PCV13 should be obtained first. All adults aged 48 years and older should be immunized. An adult younger than age 73 years who has certain medical conditions should be immunized. Any person who resides in a nursing home or long-term care facility should be immunized. An adult smoker should be immunized. People with an immunocompromised condition and certain other conditions should receive both PCV13 and PPSV23 vaccines. People with human immunodeficiency virus (HIV) infection should be immunized as soon as possible after diagnosis. Immunization during chemotherapy or radiation therapy should be avoided. Routine use of PPSV23 vaccine is not recommended for American Indians, [place] Natives, or people younger than 65 years unless there are medical conditions that require PPSV23 vaccine. When indicated, people who have unknown immunization and have no record of immunization should receive PPSV23 vaccine. One-time  revaccination 5 years after the first dose of PPSV23 is recommended for people aged 19-64 years who have chronic kidney failure, nephrotic syndrome, asplenia, or immunocompromised conditions. People who received 1-2 doses of PPSV23 before age 108 years should receive another dose of PPSV23 vaccine at age 74 years or later if at least 5 years have passed since the previous dose. Doses of PPSV23 are not needed for people immunized with PPSV23 at or after age 54 years.  Hepatitis A vaccine. Adults who wish to be protected from this disease, have certain high-risk conditions, work with hepatitis A-infected animals, work in hepatitis A research labs, or travel to or work in countries with a high rate of hepatitis A should be immunized. Adults who were previously unvaccinated and who anticipate close contact with an international adoptee during the first 60 days after arrival in the [place] from a country with a high rate of hepatitis A should be immunized.  Hepatitis B vaccine. Adults should be immunized if they wish to be protected from this disease, have certain high-risk conditions, may be exposed to blood or other infectious body fluids, are household contacts or sex partners of hepatitis B positive people, are clients or workers in certain care facilities, or travel to or work in countries with a high rate of hepatitis B.  Preventive Service / Frequency  Ages 63 to 27  Blood pressure check.  Lipid and cholesterol check.  Hepatitis C blood test.** / For any individual with known risks for hepatitis C.  Skin self-exam. / Monthly.  Influenza vaccine. / Every year.  Tetanus, diphtheria, and acellular pertussis (Tdap, Td) vaccine.** / Consult your health care provider. 1 dose of Td every 10 years.  HPV vaccine. / 3 doses over 6 months, if 37 or younger.  Measles, mumps, rubella (MMR) vaccine.** / You need at least 1 dose of MMR if you were born in 1957 or later. You may also need a second  dose.  Pneumococcal 13-valent conjugate (PCV13) vaccine.** / Consult your health care provider.  Pneumococcal polysaccharide (PPSV23) vaccine.** / 1 to 2 doses if you smoke cigarettes or if you have certain conditions.  Meningococcal vaccine.** / 1 dose if you are age 60 to 34 years and a Market researcher living in a residence hall, or have one of several medical conditions. You may also need additional booster doses.  Hepatitis  A vaccine.** / Consult your health care provider.  Hepatitis B vaccine.** / Consult your health care provider.

## 2014-06-24 ENCOUNTER — Other Ambulatory Visit: Payer: Self-pay | Admitting: Internal Medicine

## 2014-06-24 DIAGNOSIS — R7989 Other specified abnormal findings of blood chemistry: Secondary | ICD-10-CM | POA: Insufficient documentation

## 2014-06-24 DIAGNOSIS — R945 Abnormal results of liver function studies: Secondary | ICD-10-CM | POA: Insufficient documentation

## 2014-06-24 LAB — URINALYSIS, MICROSCOPIC ONLY
BACTERIA UA: NONE SEEN
CASTS: NONE SEEN
Crystals: NONE SEEN
Squamous Epithelial / LPF: NONE SEEN

## 2014-06-24 LAB — MICROALBUMIN / CREATININE URINE RATIO
Creatinine, Urine: 71.3 mg/dL
Microalb Creat Ratio: 5.6 mg/g (ref 0.0–30.0)
Microalb, Ur: 0.4 mg/dL (ref <2.0)

## 2014-06-24 LAB — TESTOSTERONE: Testosterone: 211 ng/dL — ABNORMAL LOW (ref 300–890)

## 2014-06-24 LAB — INSULIN, RANDOM: INSULIN: 5.6 u[IU]/mL (ref 2.0–19.6)

## 2014-06-24 LAB — VITAMIN D 25 HYDROXY (VIT D DEFICIENCY, FRACTURES): VIT D 25 HYDROXY: 47 ng/mL (ref 30–100)

## 2014-06-25 LAB — TB SKIN TEST
Induration: 0 mm
TB Skin Test: NEGATIVE

## 2014-06-26 ENCOUNTER — Other Ambulatory Visit: Payer: Self-pay

## 2014-06-30 ENCOUNTER — Other Ambulatory Visit: Payer: 59

## 2014-06-30 ENCOUNTER — Other Ambulatory Visit: Payer: Self-pay | Admitting: Internal Medicine

## 2014-06-30 DIAGNOSIS — R7989 Other specified abnormal findings of blood chemistry: Secondary | ICD-10-CM

## 2014-06-30 DIAGNOSIS — R945 Abnormal results of liver function studies: Secondary | ICD-10-CM

## 2014-06-30 LAB — HEPATIC FUNCTION PANEL
ALT: 30 U/L (ref 0–53)
AST: 19 U/L (ref 0–37)
Albumin: 4.4 g/dL (ref 3.5–5.2)
Alkaline Phosphatase: 66 U/L (ref 39–117)
Bilirubin, Direct: 0.1 mg/dL (ref 0.0–0.3)
Indirect Bilirubin: 0.3 mg/dL (ref 0.2–1.2)
Total Bilirubin: 0.4 mg/dL (ref 0.2–1.2)
Total Protein: 7.1 g/dL (ref 6.0–8.3)

## 2014-06-30 LAB — GAMMA GT: GGT: 47 U/L (ref 7–51)

## 2014-06-30 LAB — PROTIME-INR
INR: 0.93 (ref <1.50)
Prothrombin Time: 12.5 seconds (ref 11.6–15.2)

## 2014-06-30 LAB — HEPATITIS A ANTIBODY, TOTAL: Hep A Total Ab: NONREACTIVE

## 2014-06-30 LAB — HEPATITIS B SURFACE ANTIBODY,QUALITATIVE: Hep B S Ab: NEGATIVE

## 2014-06-30 LAB — HEPATITIS B CORE ANTIBODY, TOTAL: Hep B Core Total Ab: NONREACTIVE

## 2014-06-30 LAB — HEPATITIS C ANTIBODY: HCV Ab: NEGATIVE

## 2014-07-01 LAB — MITOCHONDRIAL/SMOOTH MUSCLE AB PNL
Mitochondrial M2 Ab, IgG: 0.17 (ref <0.91)
Smooth Muscle Ab: 8 U (ref <20)

## 2014-07-01 LAB — ANTI-DNA ANTIBODY, DOUBLE-STRANDED: ds DNA Ab: <1 IU/mL

## 2014-07-02 LAB — HEPATITIS B E ANTIBODY: Hepatitis Be Antibody: NONREACTIVE

## 2014-07-02 LAB — CERULOPLASMIN: Ceruloplasmin: 19 mg/dL (ref 18–36)

## 2014-07-03 ENCOUNTER — Other Ambulatory Visit: Payer: Self-pay | Admitting: Internal Medicine

## 2014-07-04 LAB — COPPER, SERUM: Copper: 81 ug/dL (ref 70–175)

## 2014-07-19 ENCOUNTER — Other Ambulatory Visit: Payer: Self-pay | Admitting: Internal Medicine

## 2014-08-11 ENCOUNTER — Other Ambulatory Visit: Payer: Self-pay | Admitting: Internal Medicine

## 2014-08-11 DIAGNOSIS — E349 Endocrine disorder, unspecified: Secondary | ICD-10-CM

## 2014-08-11 DIAGNOSIS — F988 Other specified behavioral and emotional disorders with onset usually occurring in childhood and adolescence: Secondary | ICD-10-CM

## 2014-08-11 DIAGNOSIS — G4700 Insomnia, unspecified: Secondary | ICD-10-CM

## 2014-08-11 MED ORDER — AMPHETAMINE-DEXTROAMPHETAMINE 20 MG PO TABS: ORAL_TABLET | ORAL | Status: DC

## 2014-08-11 MED ORDER — ALPRAZOLAM 1 MG PO TABS: ORAL_TABLET | ORAL | Status: AC

## 2014-08-11 MED ORDER — TESTOSTERONE CYPIONATE 200 MG/ML IM SOLN: INTRAMUSCULAR | Status: DC

## 2014-10-08 ENCOUNTER — Other Ambulatory Visit: Payer: Self-pay | Admitting: Internal Medicine

## 2014-10-08 DIAGNOSIS — F988 Other specified behavioral and emotional disorders with onset usually occurring in childhood and adolescence: Secondary | ICD-10-CM

## 2014-10-08 MED ORDER — AMPHETAMINE-DEXTROAMPHETAMINE 20 MG PO TABS: ORAL_TABLET | ORAL | Status: DC

## 2014-10-31 ENCOUNTER — Ambulatory Visit (INDEPENDENT_AMBULATORY_CARE_PROVIDER_SITE_OTHER): Payer: 59 | Admitting: Internal Medicine

## 2014-10-31 VITALS — BP 132/80 | HR 84 | Temp 98.0°F | Resp 16 | Ht 72.0 in | Wt 261.0 lb

## 2014-10-31 DIAGNOSIS — M7581 Other shoulder lesions, right shoulder: Secondary | ICD-10-CM | POA: Diagnosis not present

## 2014-10-31 MED ORDER — PREDNISONE 20 MG PO TABS: ORAL_TABLET | ORAL | Status: DC

## 2014-10-31 MED ORDER — TRAMADOL HCL 50 MG PO TABS: 50.0000 mg | ORAL_TABLET | Freq: Four times a day (QID) | ORAL | Status: DC | PRN

## 2014-10-31 NOTE — Patient Instructions (Signed)
Rotator Cuff Tendinitis  Rotator cuff tendinitis is inflammation of the tough, cord-like bands that connect muscle to bone (tendons) in your rotator cuff. Your rotator cuff is the collection of all the muscles and tendons that connect your arm to your shoulder. Your rotator cuff holds the head of your upper arm bone (humerus) in the cup (fossa) of your shoulder blade (scapula). CAUSES Rotator cuff tendinitis is usually caused by overusing the joint involved.  SIGNS AND SYMPTOMS  Deep ache in the shoulder also felt on the outside upper arm over the shoulder muscle.  Point tenderness over the area that is injured.  Pain comes on gradually and becomes worse with lifting the arm to the side (abduction) or turning it inward (internal rotation).  May lead to a chronic tear: When a rotator cuff tendon becomes inflamed, it runs the risk of losing its blood supply, causing some tendon fibers to die. This increases the risk that the tendon can fray and partially or completely tear. DIAGNOSIS Rotator cuff tendinitis is diagnosed by taking a medical history, performing a physical exam, and reviewing results of imaging exams. The medical history is useful to help determine the type of rotator cuff injury. The physical exam will include looking at the injured shoulder, feeling the injured area, and watching you do range-of-motion exercises. X-ray exams are typically done to rule out other causes of shoulder pain, such as fractures. MRI is the imaging exam usually used for significant shoulder injuries. Sometimes a dye study called CT arthrogram is done, but it is not as widely used as MRI. In some institutions, special ultrasound tests may also be used to aid in the diagnosis. TREATMENT  Less Severe Cases  Use of a sling to rest the shoulder for a short period of time. Prolonged use of the sling can cause stiffness, weakness, and loss of motion of the shoulder joint.  Anti-inflammatory medicines, such as  ibuprofen or naproxen sodium, may be prescribed. More Severe Cases  Physical therapy.  Use of steroid injections into the shoulder joint.  Surgery. HOME CARE INSTRUCTIONS   Use a sling or splint until the pain decreases. Prolonged use of the sling can cause stiffness, weakness, and loss of motion of the shoulder joint.  Apply ice to the injured area:  Put ice in a plastic bag.  Place a towel between your skin and the bag.  Leave the ice on for 20 minutes, 2-3 times a day.  Try to avoid use other than gentle range of motion while your shoulder is painful. Use the shoulder and exercise only as directed by your health care provider. Stop exercises or range of motion if pain or discomfort increases, unless directed otherwise by your health care provider.  Only take over-the-counter or prescription medicines for pain, discomfort, or fever as directed by your health care provider.  If you were given a shoulder sling and straps (immobilizer), do not remove it except as directed, or until you see a health care provider for a follow-up exam. If you need to remove it, move your arm as little as possible or as directed.  You may want to sleep on several pillows at night to lessen swelling and pain. SEEK IMMEDIATE MEDICAL CARE IF:   Your shoulder pain increases or new pain develops in your arm, hand, or fingers and is not relieved with medicines.  You have new, unexplained symptoms, especially increased numbness in the hands or loss of strength.  You develop any worsening of the problems  that brought you in for care.  Your arm, hand, or fingers are numb or tingling.  Your arm, hand, or fingers are swollen, painful, or turn white or blue. MAKE SURE YOU:  Understand these instructions.  Will watch your condition.  Will get help right away if you are not doing well or get worse. Document Released: 04/23/2003 Document Revised: 11/21/2012 Document Reviewed: 09/12/2012 [facility] Patient  Information 2015 [place], [place]. This information is not intended to replace advice given to you by your health care provider. Make sure you discuss any questions you have with your health care provider.

## 2014-10-31 NOTE — Progress Notes (Signed)
   Subjective:    Patient ID: Ernest Richmond, male    DOB: 24-Jun-1985, 29 y.o.   MRN: [id number]  Shoulder Pain  Pertinent negatives include no fever.  Patient presents to the office for evaluation of right shoulder pain which start last week.  Patient reports that he injured it at work.  He reports that he was lifting a motor above his head on a ladder.  He reports that he had no strength and he had a very hard time pushing on it to try to adjust it.  He reports that he has a dull burning and aching sensation most of the time, but it is worse with certain movements.  He reports that most of his pain is with forward flexion above 90 degrees.  Face to hand motions are okay.  He is right hand dominant.  He has been taking ibuprofen, bengay with little relief.  He has never had a formal injury to the shoulder other than stingers in the past while playing football.      Review of Systems  Constitutional: Negative for fever and chills.  Gastrointestinal: Negative for nausea and vomiting.  Musculoskeletal: Positive for arthralgias. Negative for joint swelling.  Skin: Negative for color change.       Objective:   Physical Exam  Constitutional: He appears well-developed and well-nourished. No distress.  HENT:  Head: Normocephalic.  Mouth/Throat: Oropharynx is clear and moist. No oropharyngeal exudate.  Eyes: Conjunctivae are normal. No scleral icterus.  Neck: Normal range of motion. Neck supple. No JVD present. No thyromegaly present.  Cardiovascular: Normal rate, regular rhythm, normal heart sounds and intact distal pulses.   Pulses:      Radial pulses are 2+ on the right side, and 2+ on the left side.  Pulmonary/Chest: Effort normal and breath sounds normal. No respiratory distress. He has no wheezes. He has no rales. He exhibits no tenderness.  Abdominal: Soft. Bowel sounds are normal. He exhibits no distension and no mass. There is no tenderness. There is no rebound and no guarding.   Musculoskeletal:       Right shoulder: He exhibits pain. He exhibits normal range of motion, no tenderness, no bony tenderness, no swelling, no effusion, no crepitus, no deformity, no laceration, no spasm, normal pulse and normal strength.       Right hand: Normal sensation noted. Normal strength noted.  Positive speeds test.  Pain with empty can test but with 5/5 strength in all directions  Lymphadenopathy:    He has no cervical adenopathy.  Skin: He is not diaphoretic.  Nursing note and vitals reviewed.   Filed Vitals:   10/31/14 0906  BP: 132/80  Pulse: 84  Temp: 98 F (36.7 C)  Resp: 16          Assessment & Plan:    1. Rotator cuff tendonitis, right -prednisone 2 week taper -tramadol prn for pain -ibuprofen or tylenol prn for pain

## 2014-11-13 ENCOUNTER — Other Ambulatory Visit: Payer: Self-pay | Admitting: *Deleted

## 2014-11-13 DIAGNOSIS — F988 Other specified behavioral and emotional disorders with onset usually occurring in childhood and adolescence: Secondary | ICD-10-CM

## 2014-11-13 DIAGNOSIS — E349 Endocrine disorder, unspecified: Secondary | ICD-10-CM

## 2014-11-13 MED ORDER — AMPHETAMINE-DEXTROAMPHETAMINE 20 MG PO TABS: ORAL_TABLET | ORAL | Status: DC

## 2014-11-13 MED ORDER — TESTOSTERONE CYPIONATE 200 MG/ML IM SOLN: INTRAMUSCULAR | Status: DC

## 2014-11-17 ENCOUNTER — Ambulatory Visit (INDEPENDENT_AMBULATORY_CARE_PROVIDER_SITE_OTHER): Payer: 59 | Admitting: Physician Assistant

## 2014-11-17 ENCOUNTER — Encounter: Payer: Self-pay | Admitting: Physician Assistant

## 2014-11-17 VITALS — BP 126/80 | HR 83 | Temp 97.5°F | Resp 16 | Ht 72.0 in | Wt 252.0 lb

## 2014-11-17 DIAGNOSIS — J0100 Acute maxillary sinusitis, unspecified: Secondary | ICD-10-CM

## 2014-11-17 MED ORDER — AZITHROMYCIN 250 MG PO TABS: ORAL_TABLET | ORAL | Status: AC

## 2014-11-17 NOTE — Patient Instructions (Signed)
Sinusitis can be uncomfortable. People with sinusitis have congestion with yellow/green/gray discharge, sinus pain/pressure, pain around the eyes. Sinus infections almost ALWAYS stem from a viral infection and antibiotics don't work against a virus. Even when bacteria is responsible, the infections usually clear up on their own in a week or so.   PLEASE TRY TO DO OVER THE COUNTER TREATMENT AND PREDNISONE FOR 5-7 DAYS AND IF YOU ARE NOT GETTING BETTER OR GETTING WORSE THEN YOU CAN START ON AN ANTIBIOTIC GIVEN.  Can take the prednisone AT NIGHT WITH DINNER, it take 8-12 hours to start working so it will NOT affect your sleeping if you take it at night with your food!! Take two pills the first night and 1 or two pill the second night and then 1 pill the other nights.   Risk of antibiotic use: About 1 in 4 people who take antibiotics have side effects including stomach problems, dizziness, or rashes. Those problems clear up soon after stopping the drugs, but in rare cases antibiotics can cause severe allergic reaction. Over use of antibiotics also encourages the growth of bacteria that can't be controlled easily with drugs. That makes you more vunerable to antibiotic-resistant infections and undermines the benefits of antibiotics for others.   Waste of Money: Antibiotics often aren't very expensive, but any money spent on unnecessary drugs is money down the drain.   When are antibiotics needed? Only when symptoms last longer than a week.  Start to improve but then worsen again  -It can take up to 2 weeks to feel better.   -If you do not get better in 7-10 days (Have fever, facial pain, dental pain and swelling), then please call the office and it is now appropriate to start an antibiotic.   -Please take Tylenol or Ibuprofen for pain. -Acetaminiphen 325mg orally every 4-6 hours for pain.  Max: 10 per day -Ibuprofen 200mg orally every 6-8 hours for pain.  Take with food to avoid ulcers.   Max 10 per  day  Please pick one of the over the counter allergy medications below and take it once daily for allergies.  Claritin or loratadine cheapest but likely the weakest  Zyrtec or certizine at night because it can make you sleepy The strongest is allegra or fexafinadine  Cheapest at [employer], [employer], [employer]  -While drinking fluids, pinch and hold nose close and swallow.  This will help open up your eustachian tubes to drain the fluid behind your ear drums. -Try steam showers to open your nasal passages.   Drink lots of water to stay hydrated and to thin mucous.  Flonase/Nasonex is to help the inflammation.  Take 2 sprays in each nostril at bedtime.  Make sure you spray towards the outside of each nostril towards the outer corner of your eye, hold nose close and tilt head back.  This will help the medication get into your sinuses.  If you do not like this medication, then use saline nasal sprays same directions as above for Flonase. Stop the medication right away if you get blurring of your vision or nose bleeds.  Sinusitis Sinusitis is redness, soreness, and inflammation of the paranasal sinuses. Paranasal sinuses are air pockets within the bones of your face (beneath the eyes, the middle of the forehead, or above the eyes). In healthy paranasal sinuses, mucus is able to drain out, and air is able to circulate through them by way of your nose. However, when your paranasal sinuses are inflamed, mucus and air can   become trapped. This can allow bacteria and other germs to grow and cause infection. Sinusitis can develop quickly and last only a short time (acute) or continue over a long period (chronic). Sinusitis that lasts for more than 12 weeks is considered chronic.  CAUSES  Causes of sinusitis include: Allergies. Structural abnormalities, such as displacement of the cartilage that separates your nostrils (deviated septum), which can decrease the air flow through your nose and sinuses and affect sinus  drainage. Functional abnormalities, such as when the small hairs (cilia) that line your sinuses and help remove mucus do not work properly or are not present. SIGNS AND SYMPTOMS  Symptoms of acute and chronic sinusitis are the same. The primary symptoms are pain and pressure around the affected sinuses. Other symptoms include: Upper toothache. Earache. Headache. Bad breath. Decreased sense of smell and taste. A cough, which worsens when you are lying flat. Fatigue. Fever. Thick drainage from your nose, which often is green and may contain pus (purulent). Swelling and warmth over the affected sinuses. DIAGNOSIS  Your health care provider will perform a physical exam. During the exam, your health care provider may: Look in your nose for signs of abnormal growths in your nostrils (nasal polyps).  Tap over the affected sinus to check for signs of infection. View the inside of your sinuses (endoscopy) using an imaging device that has a light attached (endoscope). If your health care provider suspects that you have chronic sinusitis, one or more of the following tests may be recommended: Allergy tests. Nasal culture. A sample of mucus is taken from your nose, sent to a lab, and screened for bacteria. Nasal cytology. A sample of mucus is taken from your nose and examined by your health care provider to determine if your sinusitis is related to an allergy. TREATMENT  Most cases of acute sinusitis are related to a viral infection and will resolve on their own within 10 days. Sometimes medicines are prescribed to help relieve symptoms (pain medicine, decongestants, nasal steroid sprays, or saline sprays).  However, for sinusitis related to a bacterial infection, your health care provider will prescribe antibiotic medicines. These are medicines that will help kill the bacteria causing the infection.  Rarely, sinusitis is caused by a fungal infection. In theses cases, your health care provider will  prescribe antifungal medicine. For some cases of chronic sinusitis, surgery is needed. Generally, these are cases in which sinusitis recurs more than 3 times per year, despite other treatments. HOME CARE INSTRUCTIONS  Drink plenty of water. Water helps thin the mucus so your sinuses can drain more easily. Use a humidifier. Inhale steam 3 to 4 times a day (for example, sit in the bathroom with the shower running). Apply a warm, moist washcloth to your face 3 to 4 times a day, or as directed by your health care provider. Use saline nasal sprays to help moisten and clean your sinuses. Take medicines only as directed by your health care provider. If you were prescribed either an antibiotic or antifungal medicine, finish it all even if you start to feel better. SEEK IMMEDIATE MEDICAL CARE IF: You have increasing pain or severe headaches. You have nausea, vomiting, or drowsiness. You have swelling around your face. You have vision problems. You have a stiff neck. You have difficulty breathing. MAKE SURE YOU:  Understand these instructions. Will watch your condition. Will get help right away if you are not doing well or get worse. Document Released: 01/31/2005 Document Revised: 06/17/2013 Document Reviewed: 02/15/2011 [employer]   Patient Information 2015 [employer]. This information is not intended to replace advice given to you by your health care provider. Make sure you discuss any questions you have with your health care provider.   

## 2014-11-17 NOTE — Progress Notes (Signed)
   Subjective:    Patient ID: Ernest Richmond, male    DOB: Aug 11, 1985, 29 y.o.   MRN: [id number]  HPI 29 y.o. WM presents with sinus symptoms x 1-2 weeks. Was on prednisone for his shoulder, had sore throat, sinus issues then that was better with the medication but when he stopped the prednisone symptoms have been progressively worse. Sinus congestion, sore throat. Denies fever, chills.   Blood pressure 126/80, pulse 83, temperature 97.5 F (36.4 C), temperature source Temporal, resp. rate 16, height 6' (1.829 m), weight 252 lb (114.306 kg), SpO2 97 %.   Review of Systems  Constitutional: Negative for fever, chills and diaphoresis.  HENT: Positive for congestion, postnasal drip, rhinorrhea, sinus pressure and sore throat. Negative for ear pain, sneezing, trouble swallowing and voice change.   Eyes: Negative.   Respiratory: Positive for cough. Negative for chest tightness, shortness of breath and wheezing.   Cardiovascular: Negative.   Gastrointestinal: Negative.   Genitourinary: Negative.   Musculoskeletal: Negative.  Negative for neck pain.  Neurological: Negative.  Negative for headaches.       Objective:   Physical Exam  Constitutional: He appears well-developed and well-nourished.  HENT:  Head: Normocephalic and atraumatic.  Right Ear: External ear normal.  Left Ear: External ear normal.  Nose: Right sinus exhibits maxillary sinus tenderness. Left sinus exhibits maxillary sinus tenderness.  Mouth/Throat: Oropharynx is clear and moist.  Eyes: Conjunctivae are normal. Pupils are equal, round, and reactive to light.  Neck: Normal range of motion. Neck supple.  Cardiovascular: Normal rate, regular rhythm and normal heart sounds.   Pulmonary/Chest: Effort normal and breath sounds normal. He has no wheezes.  Abdominal: Soft. Bowel sounds are normal.  Lymphadenopathy:    He has no cervical adenopathy.  Skin: Skin is warm and dry.      Assessment & Plan:  1. Acute maxillary  sinusitis, recurrence not specified - azithromycin (ZITHROMAX) 250 MG tablet; Take 2 tablets (500 mg) on  Day 1,  followed by 1 tablet (250 mg) once daily on Days 2 through 5.  Dispense: 6 each; Refill: 1

## 2014-12-20 ENCOUNTER — Other Ambulatory Visit: Payer: Self-pay | Admitting: Internal Medicine

## 2015-01-15 ENCOUNTER — Other Ambulatory Visit: Payer: Self-pay | Admitting: *Deleted

## 2015-01-15 DIAGNOSIS — F988 Other specified behavioral and emotional disorders with onset usually occurring in childhood and adolescence: Secondary | ICD-10-CM

## 2015-01-15 MED ORDER — AMPHETAMINE-DEXTROAMPHETAMINE 20 MG PO TABS: ORAL_TABLET | ORAL | Status: DC

## 2015-03-04 ENCOUNTER — Other Ambulatory Visit: Payer: Self-pay | Admitting: *Deleted

## 2015-03-04 DIAGNOSIS — F988 Other specified behavioral and emotional disorders with onset usually occurring in childhood and adolescence: Secondary | ICD-10-CM

## 2015-03-04 DIAGNOSIS — E349 Endocrine disorder, unspecified: Secondary | ICD-10-CM

## 2015-03-04 DIAGNOSIS — E291 Testicular hypofunction: Secondary | ICD-10-CM

## 2015-03-04 MED ORDER — AMPHETAMINE-DEXTROAMPHETAMINE 20 MG PO TABS: ORAL_TABLET | ORAL | Status: DC

## 2015-03-04 MED ORDER — TESTOSTERONE CYPIONATE 200 MG/ML IM SOLN: INTRAMUSCULAR | Status: DC

## 2015-03-04 MED ORDER — ALPRAZOLAM 1 MG PO TABS: ORAL_TABLET | ORAL | Status: DC

## 2015-03-04 MED ORDER — "NEEDLE (DISP) 21G X 1"" MISC": Status: DC

## 2015-04-21 ENCOUNTER — Other Ambulatory Visit: Payer: Self-pay | Admitting: *Deleted

## 2015-04-21 DIAGNOSIS — F988 Other specified behavioral and emotional disorders with onset usually occurring in childhood and adolescence: Secondary | ICD-10-CM

## 2015-04-21 MED ORDER — AMPHETAMINE-DEXTROAMPHETAMINE 20 MG PO TABS: ORAL_TABLET | ORAL | Status: DC

## 2015-06-09 ENCOUNTER — Other Ambulatory Visit: Payer: Self-pay | Admitting: *Deleted

## 2015-06-09 DIAGNOSIS — F988 Other specified behavioral and emotional disorders with onset usually occurring in childhood and adolescence: Secondary | ICD-10-CM

## 2015-06-09 MED ORDER — AMPHETAMINE-DEXTROAMPHETAMINE 20 MG PO TABS: ORAL_TABLET | ORAL | Status: DC

## 2015-07-06 ENCOUNTER — Other Ambulatory Visit: Payer: Self-pay | Admitting: Internal Medicine

## 2015-07-09 ENCOUNTER — Encounter: Payer: Self-pay | Admitting: Internal Medicine

## 2015-07-22 ENCOUNTER — Other Ambulatory Visit: Payer: Self-pay | Admitting: *Deleted

## 2015-07-22 DIAGNOSIS — F988 Other specified behavioral and emotional disorders with onset usually occurring in childhood and adolescence: Secondary | ICD-10-CM

## 2015-07-22 MED ORDER — ALPRAZOLAM 1 MG PO TABS: ORAL_TABLET | ORAL | Status: DC

## 2015-07-22 MED ORDER — AMPHETAMINE-DEXTROAMPHETAMINE 20 MG PO TABS: ORAL_TABLET | ORAL | Status: DC

## 2015-07-24 ENCOUNTER — Encounter: Payer: Self-pay | Admitting: Internal Medicine

## 2015-07-24 ENCOUNTER — Ambulatory Visit (INDEPENDENT_AMBULATORY_CARE_PROVIDER_SITE_OTHER): Payer: 59 | Admitting: Internal Medicine

## 2015-07-24 ENCOUNTER — Other Ambulatory Visit: Payer: Self-pay | Admitting: *Deleted

## 2015-07-24 VITALS — BP 122/78 | HR 72 | Temp 97.7°F | Resp 16 | Ht 72.0 in | Wt 256.2 lb

## 2015-07-24 DIAGNOSIS — E291 Testicular hypofunction: Secondary | ICD-10-CM

## 2015-07-24 DIAGNOSIS — J0100 Acute maxillary sinusitis, unspecified: Secondary | ICD-10-CM | POA: Diagnosis not present

## 2015-07-24 DIAGNOSIS — J029 Acute pharyngitis, unspecified: Secondary | ICD-10-CM

## 2015-07-24 DIAGNOSIS — J0410 Acute tracheitis without obstruction: Secondary | ICD-10-CM | POA: Diagnosis not present

## 2015-07-24 MED ORDER — "NEEDLE (DISP) 21G X 1"" MISC": Status: DC

## 2015-07-24 MED ORDER — PREDNISONE 20 MG PO TABS: ORAL_TABLET | ORAL | Status: DC

## 2015-07-24 MED ORDER — LEVOFLOXACIN 500 MG PO TABS: ORAL_TABLET | ORAL | Status: DC

## 2015-07-24 NOTE — Progress Notes (Signed)
  Subjective:    Patient ID: Ernest Richmond, male    DOB: 04-24-1985, 30 y.o.   MRN: [id number]  HPI  Very nice 30 yo MWM presenting with 1 wk prodrome of S/T head/Sinus congestion and productive cough. Denies fever , chills, sweats or rash.   Medication Sig  . ALPRAZolam (XANAX) 1 MG tablet Take 1/2 to 1 tablet 2 to 3 times daily for anxiety or sleep.  Marland Kitchen amphetamine-dextroamphetamine (ADDERALL) 20 MG tablet Take 1/2 to 1 tablet 2 x daily only if needed for ADD  . buPROPion- XL 150 MG 24 hr tablet TAKE 1 TABLET BY MOUTH EVERY MORNING.  . VITAMIN D 1000 UNITS tablet Take 1,000 Units by mouth 3 (three) times daily.  . Multiple Vitamin Take 1 tablet by mouth daily.  Marland Kitchen testosterone cypionate 200 MG/ML inj 2 cc q 2 weeks or as directed   No Known Allergies   Past Medical History  Diagnosis Date  . Hypertension   . Hypogonadism male   . Vitamin D deficiency    Review of Systems  10 point systems review negative except as above.     Objective:   Physical Exam  BP 122/78 mmHg  Pulse 72  Temp(Src) 97.7 F (36.5 C)  Resp 16  Ht 6' (1.829 m)  Wt 256 lb 3.2 oz (116.212 kg)  BMI 34.74 kg/m2  HEENT - Eac's patent. TM's Nl. EOM's full. PERRLA. (+) tender bilat Maxillary areas.  NasoOroPharynx clear. Neck - supple. Nl Thyroid. Carotids 2+ & No bruits, nodes, JVD Chest - Clear equal BS w/o Rales, rhonchi, wheezes. Cor - Nl HS. RRR w/o sig MGR. PP 1(+). No edema. Abd - No palpable organomegaly, masses or tenderness. BS nl. MS- FROM w/o deformities. Muscle power, tone and bulk Nl. Gait Nl. Neuro - No obvious Cr N abnormalities.  Nl w/o focal abnormalities.    Assessment & Plan:   1. Acute pharyngitis  - levofloxacin (LEVAQUIN) 500 MG tablet; Take 1 tablet daily with food for infection  Dispense: 15 tablet; Refill: 1 - predniSONE (DELTASONE) 20 MG tablet; 1 tab 3 x day for 3 days, then 1 tab 2 x day for 3 days, then 1 tab 1 x day for 5 days  Dispense: 20 tablet; Refill: 0  2. Acute  maxillary sinusitis  - levofloxacin (LEVAQUIN) 500 MG tablet; Take 1 tablet daily with food for infection  Dispense: 15 tablet; Refill: 1 - predniSONE (DELTASONE) 20 MG tablet; 1 tab 3 x day for 3 days, then 1 tab 2 x day for 3 days, then 1 tab 1 x day for 5 days  Dispense: 20 tablet; Refill: 0  3. Tracheitis  - levofloxacin (LEVAQUIN) 500 MG tablet; Take 1 tablet daily with food for infection  Dispense: 15 tablet; Refill: 1 - predniSONE (DELTASONE) 20 MG tablet; 1 tab 3 x day for 3 days, then 1 tab 2 x day for 3 days, then 1 tab 1 x day for 5 days  Dispense: 20 tablet; Refill: 0  - discussed med & SE's

## 2015-09-14 ENCOUNTER — Encounter: Payer: Self-pay | Admitting: Internal Medicine

## 2015-09-14 ENCOUNTER — Ambulatory Visit (INDEPENDENT_AMBULATORY_CARE_PROVIDER_SITE_OTHER): Payer: 59 | Admitting: Internal Medicine

## 2015-09-14 VITALS — BP 130/80 | HR 72 | Temp 97.3°F | Resp 16 | Ht 72.5 in | Wt 250.6 lb

## 2015-09-14 DIAGNOSIS — Z136 Encounter for screening for cardiovascular disorders: Secondary | ICD-10-CM | POA: Diagnosis not present

## 2015-09-14 DIAGNOSIS — Z79899 Other long term (current) drug therapy: Secondary | ICD-10-CM

## 2015-09-14 DIAGNOSIS — K644 Residual hemorrhoidal skin tags: Secondary | ICD-10-CM

## 2015-09-14 DIAGNOSIS — Z0000 Encounter for general adult medical examination without abnormal findings: Secondary | ICD-10-CM | POA: Diagnosis not present

## 2015-09-14 DIAGNOSIS — Z1212 Encounter for screening for malignant neoplasm of rectum: Secondary | ICD-10-CM

## 2015-09-14 DIAGNOSIS — Z0001 Encounter for general adult medical examination with abnormal findings: Secondary | ICD-10-CM

## 2015-09-14 DIAGNOSIS — F988 Other specified behavioral and emotional disorders with onset usually occurring in childhood and adolescence: Secondary | ICD-10-CM

## 2015-09-14 DIAGNOSIS — Z111 Encounter for screening for respiratory tuberculosis: Secondary | ICD-10-CM | POA: Diagnosis not present

## 2015-09-14 DIAGNOSIS — E559 Vitamin D deficiency, unspecified: Secondary | ICD-10-CM

## 2015-09-14 DIAGNOSIS — R7309 Other abnormal glucose: Secondary | ICD-10-CM

## 2015-09-14 DIAGNOSIS — R5383 Other fatigue: Secondary | ICD-10-CM

## 2015-09-14 DIAGNOSIS — E782 Mixed hyperlipidemia: Secondary | ICD-10-CM

## 2015-09-14 DIAGNOSIS — I10 Essential (primary) hypertension: Secondary | ICD-10-CM

## 2015-09-14 DIAGNOSIS — E349 Endocrine disorder, unspecified: Secondary | ICD-10-CM

## 2015-09-14 LAB — LIPID PANEL
CHOL/HDL RATIO: 4 ratio (ref <=5.0)
Cholesterol: 181 mg/dL (ref 125–200)
HDL: 45 mg/dL (ref >=40)
LDL Cholesterol: 99 mg/dL (ref <130)
Triglycerides: 183 mg/dL — ABNORMAL HIGH (ref <150)
VLDL: 37 mg/dL — AB (ref <30)

## 2015-09-14 LAB — HEPATIC FUNCTION PANEL
ALT: 20 U/L (ref 9–46)
AST: 18 U/L (ref 10–40)
Albumin: 4.9 g/dL (ref 3.6–5.1)
Alkaline Phosphatase: 54 U/L (ref 40–115)
BILIRUBIN DIRECT: 0.1 mg/dL (ref <=0.2)
BILIRUBIN INDIRECT: 0.5 mg/dL (ref 0.2–1.2)
BILIRUBIN TOTAL: 0.6 mg/dL (ref 0.2–1.2)
Total Protein: 7 g/dL (ref 6.1–8.1)

## 2015-09-14 LAB — BASIC METABOLIC PANEL WITH GFR
BUN: 21 mg/dL (ref 7–25)
CHLORIDE: 102 mmol/L (ref 98–110)
CO2: 26 mmol/L (ref 20–31)
CREATININE: 0.94 mg/dL (ref 0.60–1.35)
Calcium: 9.5 mg/dL (ref 8.6–10.3)
GFR, Est African American: >89 mL/min (ref >=60)
Glucose, Bld: 85 mg/dL (ref 65–99)
POTASSIUM: 3.8 mmol/L (ref 3.5–5.3)
SODIUM: 137 mmol/L (ref 135–146)

## 2015-09-14 LAB — VITAMIN B12: Vitamin B-12: 452 pg/mL (ref 200–1100)

## 2015-09-14 LAB — IRON AND TIBC
%SAT: 38 % (ref 15–60)
Iron: 133 ug/dL (ref 50–180)
TIBC: 348 ug/dL (ref 250–425)
UIBC: 215 ug/dL (ref 125–400)

## 2015-09-14 LAB — CBC WITH DIFFERENTIAL/PLATELET
BASOS PCT: 0 %
Basophils Absolute: 0 cells/uL (ref 0–200)
EOS PCT: 1 %
Eosinophils Absolute: 74 cells/uL (ref 15–500)
HCT: 46.7 % (ref 38.5–50.0)
HEMOGLOBIN: 16 g/dL (ref 13.2–17.1)
LYMPHS ABS: 1628 {cells}/uL (ref 850–3900)
Lymphocytes Relative: 22 %
MCH: 31.8 pg (ref 27.0–33.0)
MCHC: 34.3 g/dL (ref 32.0–36.0)
MCV: 92.8 fL (ref 80.0–100.0)
MONOS PCT: 8 %
MPV: 10.8 fL (ref 7.5–12.5)
Monocytes Absolute: 592 cells/uL (ref 200–950)
NEUTROS ABS: 5106 {cells}/uL (ref 1500–7800)
Neutrophils Relative %: 69 %
PLATELETS: 187 10*3/uL (ref 140–400)
RBC: 5.03 MIL/uL (ref 4.20–5.80)
RDW: 13.5 % (ref 11.0–15.0)
WBC: 7.4 10*3/uL (ref 3.8–10.8)

## 2015-09-14 LAB — URINALYSIS, ROUTINE W REFLEX MICROSCOPIC
Bilirubin Urine: NEGATIVE
GLUCOSE, UA: NEGATIVE
HGB URINE DIPSTICK: NEGATIVE
KETONES UR: NEGATIVE
LEUKOCYTES UA: NEGATIVE
NITRITE: NEGATIVE
PH: 6.5 (ref 5.0–8.0)
PROTEIN: NEGATIVE
Specific Gravity, Urine: 1.005 (ref 1.001–1.035)

## 2015-09-14 LAB — HEMOGLOBIN A1C
Hgb A1c MFr Bld: 5 % (ref <5.7)
Mean Plasma Glucose: 97 mg/dL

## 2015-09-14 LAB — TSH: TSH: 1.03 m[IU]/L (ref 0.40–4.50)

## 2015-09-14 LAB — MAGNESIUM: MAGNESIUM: 1.8 mg/dL (ref 1.5–2.5)

## 2015-09-14 MED ORDER — TESTOSTERONE CYPIONATE 200 MG/ML IM SOLN: INTRAMUSCULAR | 3 refills | Status: DC

## 2015-09-14 MED ORDER — AMPHETAMINE-DEXTROAMPHETAMINE 20 MG PO TABS: ORAL_TABLET | ORAL | 0 refills | Status: DC

## 2015-09-14 MED ORDER — HYDROCORTISONE 2.5 % RE CREA: TOPICAL_CREAM | RECTAL | 99 refills | Status: DC

## 2015-09-14 NOTE — Progress Notes (Signed)
[facility]   Unk Pinto, M.D.    Uvaldo Bristle. Silverio Lay, P.A.-C      Starlyn Skeans, P.A.-C   [facility]                [address] [place], [place] [id number] Telephone [phone] Telefax [phone]  Annual  Screening/Preventative Visit And Comprehensive Evaluation & Examination     This very nice 30 y.o. single WM presents for a Wellness/Preventative Visit & comprehensive evaluation and management of multiple medical co-morbidities.  Patient has been followed for labile HTN, Prediabetes, Hyperlipidemia and Vitamin D Deficiency. Today patient is also c/o an uncomfortable pruritic hemorrhoidal tag and desires to have it removed. Patient also has Adult ADD and seems adequately controlled with maintenance Wellbutrin and takes Adderall during work days with improved work focusing, concentration and productivity.      Patient has hx/o elevated BP in 2011 and has since been monitored expectantly. Patient's BP has been controlled and today's BP: 130/80. Patient denies any cardiac symptoms as chest pain, palpitations, shortness of breath, dizziness or ankle swelling.     Patient likewise is monitored expectantly for hyperlipidemia which is controlled with diet. Last lipids were at goal with elevated Triglycerides: Lab Results  Component Value Date   CHOL 186 06/23/2014   HDL 52 06/23/2014   LDLCALC 102 (H) 06/23/2014   TRIG 158 (H) 06/23/2014   CHOLHDL 3.6 06/23/2014      Patient has Morbid Obesity (BMI 33+) and consequently is monitored expectantly for prediabetes and patient denies reactive hypoglycemic symptoms, visual blurring, diabetic polys or paresthesias. Last A1c was  Lab Results  Component Value Date   HGBA1C 4.8 06/23/2014       Patient has hx/o Low Testosterone 192 in 2010 and has been on Depo-Testosterone injections (last 3 days ago).  Finally, patient has history of Vitamin D Deficiency  of "8" in 2008  and last vitamin D was  Lab Results  Component Value Date   VD25OH 47 06/23/2014   Current Outpatient Prescriptions on File Prior to Visit  Medication Sig  . ALPRAZolam (XANAX) 1 MG tablet Take 1/2 to 1 tablet 2 to 3 times daily for anxiety or sleep.  Marland Kitchen buPROPion (WELLBUTRIN XL) 150 MG 24 hr tablet TAKE 1 TABLET BY MOUTH EVERY MORNING.  . cholecalciferol (VITAMIN D) 1000 UNITS tablet Take 1,000 Units by mouth 3 (three) times daily.  . Multiple Vitamin (MULTIVITAMIN) tablet Take 1 tablet by mouth daily.  Marland Kitchen NEEDLE, DISP, 21 G (YALE DISP NEEDLES 21GX1") 21G X 1" MISC 1 & 1/2 "     21 G needles and 3 cc syringes   No current facility-administered medications on file prior to visit.    No Known Allergies Past Medical History:  Diagnosis Date  . Hypertension   . Hypogonadism male   . Vitamin D deficiency    Health Maintenance  Topic Date Due  . HIV Screening  09/26/1998  . INFLUENZA VACCINE  09/15/2015  . TETANUS/TDAP  01/15/2019   Immunization History  Administered Date(s) Administered  . Influenza Whole 01/18/2010  . PPD Test 06/21/2013, 06/23/2014, 09/14/2015  . Pneumococcal Polysaccharide-23 01/18/2010  . Tdap 01/14/2009   Past Surgical History:  Procedure Laterality Date  . KNEE ARTHROSCOPY Left   . WISDOM TOOTH EXTRACTION     Family History  Problem Relation Age of Onset  .  Arthritis Mother   . Hyperlipidemia Father   . Hypertension Father   . Diabetes Father   . Gout Father     Social History   Social History  . Marital status: Single    Spouse name: N/A  . Number of children: N/A  . Years of education: N/A   Occupational History  . Works [employer].   Social History Main Topics  . Smoking status: Former Smoker    Packs/day: 0.00    Quit date: 03/17/2012  . Smokeless tobacco: Not on file     Comment: smoke occasionally, about 1 cigarette a week  . Alcohol use 10.0 oz/week    20 Standard drinks or equivalent per week     Comment:  beer-drinks 12-15 per week  . Drug use: No  . Sexual activity: Not on file    ROS Constitutional: Denies fever, chills, weight loss/gain, headaches, insomnia,  night sweats or change in appetite. Does c/o fatigue. Eyes: Denies redness, blurred vision, diplopia, discharge, itchy or watery eyes.  ENT: Denies discharge, congestion, post nasal drip, epistaxis, sore throat, earache, hearing loss, dental pain, Tinnitus, Vertigo, Sinus pain or snoring.  Cardio: Denies chest pain, palpitations, irregular heartbeat, syncope, dyspnea, diaphoresis, orthopnea, PND, claudication or edema Respiratory: denies cough, dyspnea, DOE, pleurisy, hoarseness, laryngitis or wheezing.  Gastrointestinal: Denies dysphagia, heartburn, reflux, water brash, pain, cramps, nausea, vomiting, bloating, diarrhea, constipation, hematemesis, melena, hematochezia, jaundice or hemorrhoids Genitourinary: Denies dysuria, frequency, urgency, nocturia, hesitancy, discharge, hematuria or flank pain Musculoskeletal: Denies arthralgia, myalgia, stiffness, Jt. Swelling, pain, limp or strain/sprain. Denies Falls. Skin: Denies puritis, rash, hives, warts, acne, eczema or change in skin lesion Neuro: No weakness, tremor, incoordination, spasms, paresthesia or pain Psychiatric: Denies confusion, memory loss or sensory loss. Denies Depression. Endocrine: Denies change in weight, skin, hair change, nocturia, and paresthesia, diabetic polys, visual blurring or hyper / hypo glycemic episodes.  Heme/Lymph: No excessive bleeding, bruising or enlarged lymph nodes.  Physical Exam  BP 130/80   Pulse 72   Temp 97.3 F (36.3 C)   Resp 16   Ht 6' 0.5" (1.842 m)   Wt 250 lb 9.6 oz (113.7 kg)   BMI 33.52 kg/m   General Appearance: Well nourished, in no apparent distress. Eyes: PERRLA, EOMs, conjunctiva no swelling or erythema, normal fundi and vessels. Sinuses: No frontal/maxillary tenderness ENT/Mouth: EACs patent / TMs  nl. Nares clear without  erythema, swelling, mucoid exudates. Oral hygiene is good. No erythema, swelling, or exudate. Tongue normal, non-obstructing. Tonsils not swollen or erythematous. Hearing normal.  Neck: Supple, thyroid normal. No bruits, nodes or JVD. Respiratory: Respiratory effort normal.  BS equal and clear bilateral without rales, rhonci, wheezing or stridor. Cardio: Heart sounds are normal with regular rate and rhythm and no murmurs, rubs or gallops. Peripheral pulses are normal and equal bilaterally without edema. No aortic or femoral bruits. Chest: symmetric with normal excursions and percussion.  Abdomen: Soft, with Nl bowel sounds. Nontender, no guarding, rebound, hernias, masses, or organomegaly.  Lymphatics: Non tender without lymphadenopathy.  Genitourinary: No hernias. Testes nl. DRE -deferred for age. Does have an apparent inflamed external hemorrhoidal tag. Musculoskeletal: Full ROM all peripheral extremities, joint stability, 5/5 strength, and normal gait. Skin: Warm and dry without rashes, lesions, cyanosis, clubbing or  ecchymosis.  Neuro: Cranial nerves intact, reflexes equal bilaterally. Normal muscle tone, no cerebellar symptoms. Sensation intact.  Pysch: Alert and oriented X 3 with normal affect, insight and judgment appropriate.   Assessment and Plan  1.  Annual Preventative/Screening Exam   - Microalbumin / creatinine urine ratio - POC Hemoccult Bld/Stl  - Urinalysis, Routine w reflex microscopic  - Vitamin B12 - Iron and TIBC - Testosterone - PSA - CBC with Differential/Platelet - BASIC METABOLIC PANEL WITH GFR - Hepatic function panel - Magnesium - Lipid panel - TSH - Hemoglobin A1c - Insulin, random - VITAMIN D 25 Hydroxy   2. Essential hypertension  - Microalbumin / creatinine urine ratio - TSH  3. Hyperlipidemia  - Lipid panel - TSH  4. Abnormal glucose, screening  - Hemoglobin A1c - Insulin, random  5. Vitamin D deficiency  - VITAMIN D 25 Hydroxy   6.  Testosterone deficiency  - Testosterone - PSA - testosterone cypionate (DEPO-TESTOSTERONE) 200 MG/ML injection; 2 cc q 2 weeks or as directed  Dispense: 10 mL; Refill: 3  7. ADD (attention deficit disorder)  - amphetamine-dextroamphetamine (ADDERALL) 20 MG tablet; Take 1/2 to 1 tablet 2 x daily only if needed for ADD  Dispense: 60 tablet; Refill: 0  8. Other fatigue  - Vitamin B12 - Iron and TIBC - Testosterone - CBC with Differential/Platelet - TSH  9. Encounter for screening for malignant neoplasm of rectum  - Microalbumin / creatinine urine ratio - POC Hemoccult Bld/Stl (3-Cd Home Screen); Future - Urinalysis, Routine w reflex microscopic (not at [facility]) - Vitamin B12 - Iron and TIBC - Testosterone - PSA - CBC with Differential/Platelet - BASIC METABOLIC PANEL WITH GFR - Hepatic function panel - Magnesium - Lipid panel - TSH - Hemoglobin A1c - Insulin, random - VITAMIN D 25 Hydroxy (Vit-D Deficiency, Fractures)  10. Screening for ischemic heart disease   11. Medication management  - Urinalysis, Routine w reflex microscopic (not at [facility]) - CBC with Differential/Platelet - BASIC METABOLIC PANEL WITH GFR - Hepatic function panel - Magnesium  12. Hemorrhoidal skin tags  - Ambulatory referral to General Surgery  13. Screening examination for pulmonary tuberculosis  - PPD    Continue prudent diet as discussed, weight control, BP monitoring, regular exercise, and medications as discussed.  Discussed med effects and SE's. Routine screening labs and tests as requested with regular follow-up as recommended. Over 40 minutes of exam, counseling, chart review and high complex critical decision making was performed

## 2015-09-14 NOTE — Patient Instructions (Signed)
Preventive Care for Adults  A healthy lifestyle and preventive care can promote health and wellness. Preventive health guidelines for men include the following key practices:  A routine yearly physical is a good way to check with your health care provider about your health and preventative screening. It is a chance to share any concerns and updates on your health and to receive a thorough exam.  Visit your dentist for a routine exam and preventative care every 6 months. Brush your teeth twice a day and floss once a day. Good oral hygiene prevents tooth decay and gum disease.  The frequency of eye exams is based on your age, health, family medical history, use of contact lenses, and other factors. Follow your health care provider's recommendations for frequency of eye exams.  Eat a healthy diet. Foods such as vegetables, fruits, whole grains, low-fat dairy products, and lean protein foods contain the nutrients you need without too many calories. Decrease your intake of foods high in solid fats, added sugars, and salt. Eat the right amount of calories for you.Get information about a proper diet from your health care provider, if necessary.  Regular physical exercise is one of the most important things you can do for your health. Most adults should get at least 150 minutes of moderate-intensity exercise (any activity that increases your heart rate and causes you to sweat) each week. In addition, most adults need muscle-strengthening exercises on 2 or more days a week.  Maintain a healthy weight. The body mass index (BMI) is a screening tool to identify possible weight problems. It provides an estimate of body fat based on height and weight. Your health care provider can find your BMI and can help you achieve or maintain a healthy weight.For adults 20 years and older:  A BMI below 18.5 is considered underweight.  A BMI of 18.5 to 24.9 is normal.  A BMI of 25 to 29.9 is considered overweight.  A  BMI of 30 and above is considered obese.  Maintain normal blood lipids and cholesterol levels by exercising and minimizing your intake of saturated fat. Eat a balanced diet with plenty of fruit and vegetables. Blood tests for lipids and cholesterol should begin at age 20 and be repeated every 5 years. If your lipid or cholesterol levels are high, you are over 50, or you are at high risk for heart disease, you may need your cholesterol levels checked more frequently.Ongoing high lipid and cholesterol levels should be treated with medicines if diet and exercise are not working.  If you smoke, find out from your health care provider how to quit. If you do not use tobacco, do not start.  Lung cancer screening is recommended for adults aged 55-80 years who are at high risk for developing lung cancer because of a history of smoking. A yearly low-dose CT scan of the lungs is recommended for people who have at least a 30-pack-year history of smoking and are a current smoker or have quit within the past 15 years. A pack year of smoking is smoking an average of 1 pack of cigarettes a day for 1 year (for example: 1 pack a day for 30 years or 2 packs a day for 15 years). Yearly screening should continue until the smoker has stopped smoking for at least 15 years. Yearly screening should be stopped for people who develop a health problem that would prevent them from having lung cancer treatment.  If you choose to drink alcohol, do not have more   than 2 drinks per day. One drink is considered to be 12 ounces (355 mL) of beer, 5 ounces (148 mL) of wine, or 1.5 ounces (44 mL) of liquor.  High blood pressure causes heart disease and increases the risk of stroke. Your blood pressure should be checked. Ongoing high blood pressure should be treated with medicines, if weight loss and exercise are not effective.  If you are 38-65 years old, ask your health care provider if you should take aspirin to prevent heart  disease.  Diabetes screening involves taking a blood sample to check your fasting blood sugar level. Testing should be considered at a younger age or be carried out more frequently if you are overweight and have at least 1 risk factor for diabetes.  Colorectal cancer can be detected and often prevented. Most routine colorectal cancer screening begins at the age of 56 and continues through age 26. However, your health care provider may recommend screening at an earlier age if you have risk factors for colon cancer. On a yearly basis, your health care provider may provide home test kits to check for hidden blood in the stool. Use of a small camera at the end of a tube to directly examine the colon (sigmoidoscopy or colonoscopy) can detect the earliest forms of colorectal cancer. Talk to your health care provider about this at age 96, when routine screening begins. Direct exam of the colon should be repeated every 5-10 years through age 69, unless early forms of precancerous polyps or small growths are found.  Screening for abdominal aortic aneurysm (AAA)  are recommended for persons over age 25 who have history of hypertensionor who are current or former smokers.  Talk with your health care provider about prostate cancer screening.  Testicular cancer screening is recommended for adult males. Screening includes self-exam, a health care provider exam, and other screening tests. Consult with your health care provider about any symptoms you have or any concerns you have about testicular cancer.  Use sunscreen. Apply sunscreen liberally and repeatedly throughout the day. You should seek shade when your shadow is shorter than you. Protect yourself by wearing long sleeves, pants, a wide-brimmed hat, and sunglasses year round, whenever you are outdoors.  Once a month, do a whole-body skin exam, using a mirror to look at the skin on your back. Tell your health care provider about new moles, moles that have  irregular borders, moles that are larger than a pencil eraser, or moles that have changed in shape or color.  Stay current with required vaccines (immunizations).  Influenza vaccine. All adults should be immunized every year.  Tetanus, diphtheria, and acellular pertussis (Td, Tdap) vaccine. An adult who has not previously received Tdap or who does not know his vaccine status should receive 1 dose of Tdap. This initial dose should be followed by tetanus and diphtheria toxoids (Td) booster doses every 10 years. Adults with an unknown or incomplete history of completing a 3-dose immunization series with Td-containing vaccines should begin or complete a primary immunization series including a Tdap dose. Adults should receive a Td booster every 10 years.  Zoster vaccine. One dose is recommended for adults aged 26 years or older unless certain conditions are present.    Pneumococcal 13-valent conjugate (PCV13) vaccine. When indicated, a person who is uncertain of his immunization history and has no record of immunization should receive the PCV13 vaccine. An adult aged 52 years or older who has certain medical conditions and has not been previously immunized  should receive 1 dose of PCV13 vaccine. This PCV13 should be followed with a dose of pneumococcal polysaccharide (PPSV23) vaccine. The PPSV23 vaccine dose should be obtained at least 8 weeks after the dose of PCV13 vaccine. An adult aged 104 years or older who has certain medical conditions and previously received 1 or more doses of PPSV23 vaccine should receive 1 dose of PCV13. The PCV13 vaccine dose should be obtained 1 or more years after the last PPSV23 vaccine dose.    Pneumococcal polysaccharide (PPSV23) vaccine. When PCV13 is also indicated, PCV13 should be obtained first. All adults aged 71 years and older should be immunized. An adult younger than age 40 years who has certain medical conditions should be immunized. Any person who resides in a  nursing home or long-term care facility should be immunized. An adult smoker should be immunized. People with an immunocompromised condition and certain other conditions should receive both PCV13 and PPSV23 vaccines. People with human immunodeficiency virus (HIV) infection should be immunized as soon as possible after diagnosis. Immunization during chemotherapy or radiation therapy should be avoided. Routine use of PPSV23 vaccine is not recommended for American Indians, [place] Natives, or people younger than 65 years unless there are medical conditions that require PPSV23 vaccine. When indicated, people who have unknown immunization and have no record of immunization should receive PPSV23 vaccine. One-time revaccination 5 years after the first dose of PPSV23 is recommended for people aged 19-64 years who have chronic kidney failure, nephrotic syndrome, asplenia, or immunocompromised conditions. People who received 1-2 doses of PPSV23 before age 82 years should receive another dose of PPSV23 vaccine at age 75 years or later if at least 5 years have passed since the previous dose. Doses of PPSV23 are not needed for people immunized with PPSV23 at or after age 49 years.  Hepatitis A vaccine. Adults who wish to be protected from this disease, have certain high-risk conditions, work with hepatitis A-infected animals, work in hepatitis A research labs, or travel to or work in countries with a high rate of hepatitis A should be immunized. Adults who were previously unvaccinated and who anticipate close contact with an international adoptee during the first 60 days after arrival in the [place] from a country with a high rate of hepatitis A should be immunized.  Hepatitis B vaccine. Adults should be immunized if they wish to be protected from this disease, have certain high-risk conditions, may be exposed to blood or other infectious body fluids, are household contacts or sex partners of hepatitis B positive  people, are clients or workers in certain care facilities, or travel to or work in countries with a high rate of hepatitis B.  Preventive Service / Frequency  Ages 59 to 45  Blood pressure check.  Lipid and cholesterol check.  Hepatitis C blood test.** / For any individual with known risks for hepatitis C.  Skin self-exam. / Monthly.  Influenza vaccine. / Every year.  Tetanus, diphtheria, and acellular pertussis (Tdap, Td) vaccine.** / Consult your health care provider. 1 dose of Td every 10 years.  HPV vaccine. / 3 doses over 6 months, if 51 or younger.  Measles, mumps, rubella (MMR) vaccine.** / You need at least 1 dose of MMR if you were born in 1957 or later. You may also need a second dose.  Pneumococcal 13-valent conjugate (PCV13) vaccine.** / Consult your health care provider.  Pneumococcal polysaccharide (PPSV23) vaccine.** / 1 to 2 doses if you smoke cigarettes or if you have  certain conditions.  Meningococcal vaccine.** / 1 dose if you are age 19 to 21 years and a first-year college student living in a residence hall, or have one of several medical conditions. You may also need additional booster doses.  Hepatitis A vaccine.** / Consult your health care provider.  Hepatitis B vaccine.** / Consult your health care provider. +++++++++ Recommend Adult Low Dose Aspirin or  coated  Aspirin 81 mg daily  To reduce risk of Colon Cancer 20 %,  Skin Cancer 26 % ,  Melanoma 46%  and  Pancreatic cancer 60% ++++++++++++++++++ Vitamin D goal  is between 70-100.  Please make sure that you are taking your Vitamin D as directed.  It is very important as a natural anti-inflammatory  helping hair, skin, and nails, as well as reducing stroke and heart attack risk.  It helps your bones and helps with mood. It also decreases numerous cancer risks so please take it as directed.  Low Vit D is associated with a 200-300% higher risk for CANCER  and 200-300% higher risk for HEART    ATTACK  &  STROKE.   ...................................... It is also associated with higher death rate at younger ages,  autoimmune diseases like Rheumatoid arthritis, Lupus, Multiple Sclerosis.    Also many other serious conditions, like depression, Alzheimer's Dementia, infertility, muscle aches, fatigue, fibromyalgia - just to name a few. +++++++++++++++++++++ Recommend the book "The END of DIETING" by Dr Joel Fuhrman  & the book "The END of DIABETES " by Dr Joel Fuhrman At [employer] - get book & Audio CD's    Being diabetic has a  300% increased risk for heart attack, stroke, cancer, and alzheimer- type vascular dementia. It is very important that you work harder with diet by avoiding all foods that are white. Avoid white rice (brown & wild rice is OK), white potatoes (sweetpotatoes in moderation is OK), White bread or wheat bread or anything made out of white flour like bagels, donuts, rolls, buns, biscuits, cakes, pastries, cookies, pizza crust, and pasta (made from white flour & egg whites) - vegetarian pasta or spinach or wheat pasta is OK. Multigrain breads like Arnold's or [employer], or multigrain sandwich thins or flatbreads.  Diet, exercise and weight loss can reverse and cure diabetes in the early stages.  Diet, exercise and weight loss is very important in the control and prevention of complications of diabetes which affects every system in your body, ie. Brain - dementia/stroke, eyes - glaucoma/blindness, heart - heart attack/heart failure, kidneys - dialysis, stomach - gastric paralysis, intestines - malabsorption, nerves - severe painful neuritis, circulation - gangrene & loss of a leg(s), and finally cancer and Alzheimers.    I recommend avoid fried & greasy foods,  sweets/candy, white rice (brown or wild rice or Quinoa is OK), white potatoes (sweet potatoes are OK) - anything made from white flour - bagels, doughnuts, rolls, buns, biscuits,white and wheat breads, pizza crust  and traditional pasta made of white flour & egg white(vegetarian pasta or spinach or wheat pasta is OK).  Multi-grain bread is OK - like multi-grain flat bread or sandwich thins. Avoid alcohol in excess. Exercise is also important.    Eat all the vegetables you want - avoid meat, especially red meat and dairy - especially cheese.  Cheese is the most concentrated form of trans-fats which is the worst thing to clog up our arteries. Veggie cheese is OK which can be found in the fresh produce section at [employer] or   [employer] or [employer]  +++++++++++++++++++ DASH Eating Plan  DASH stands for "Dietary Approaches to Stop Hypertension."   The DASH eating plan is a healthy eating plan that has been shown to reduce high blood pressure (hypertension). Additional health benefits may include reducing the risk of type 2 diabetes mellitus, heart disease, and stroke. The DASH eating plan may also help with weight loss. WHAT DO I NEED TO KNOW ABOUT THE DASH EATING PLAN? For the DASH eating plan, you will follow these general guidelines:  Choose foods with a percent daily value for sodium of less than 5% (as listed on the food label).  Use salt-free seasonings or herbs instead of table salt or sea salt.  Check with your health care provider or pharmacist before using salt substitutes.  Eat lower-sodium products, often labeled as "lower sodium" or "no salt added."  Eat fresh foods.  Eat more vegetables, fruits, and low-fat dairy products.  Choose whole grains. Look for the word "whole" as the first word in the ingredient list.  Choose fish   Limit sweets, desserts, sugars, and sugary drinks.  Choose heart-healthy fats.  Eat veggie cheese   Eat more home-cooked food and less restaurant, buffet, and fast food.  Limit fried foods.  Cook foods using methods other than frying.  Limit canned vegetables. If you do use them, rinse them well to decrease the sodium.  When eating at a  restaurant, ask that your food be prepared with less salt, or no salt if possible.                      WHAT FOODS CAN I EAT? Read Dr Joel Fuhrman's books on The End of Dieting & The End of Diabetes  Grains Whole grain or whole wheat bread. Brown rice. Whole grain or whole wheat pasta. Quinoa, bulgur, and whole grain cereals. Low-sodium cereals. Corn or whole wheat flour tortillas. Whole grain cornbread. Whole grain crackers. Low-sodium crackers.  Vegetables Fresh or frozen vegetables (raw, steamed, roasted, or grilled). Low-sodium or reduced-sodium tomato and vegetable juices. Low-sodium or reduced-sodium tomato sauce and paste. Low-sodium or reduced-sodium canned vegetables.   Fruits All fresh, canned (in natural juice), or frozen fruits.  Protein Products  All fish and seafood.  Dried beans, peas, or lentils. Unsalted nuts and seeds. Unsalted canned beans.  Dairy Low-fat dairy products, such as skim or 1% milk, 2% or reduced-fat cheeses, low-fat ricotta or cottage cheese, or plain low-fat yogurt. Low-sodium or reduced-sodium cheeses.  Fats and Oils Tub margarines without trans fats. Light or reduced-fat mayonnaise and salad dressings (reduced sodium). Avocado. Safflower, olive, or canola oils. Natural peanut or almond butter.  Other Unsalted popcorn and pretzels. The items listed above may not be a complete list of recommended foods or beverages. Contact your dietitian for more options.  +++++++++++++++++++  WHAT FOODS ARE NOT RECOMMENDED? Grains/ White flour or wheat flour White bread. White pasta. White rice. Refined cornbread. Bagels and croissants. Crackers that contain trans fat.  Vegetables  Creamed or fried vegetables. Vegetables in a . Regular canned vegetables. Regular canned tomato sauce and paste. Regular tomato and vegetable juices.  Fruits Dried fruits. Canned fruit in light or heavy syrup. Fruit juice.  Meat and Other Protein Products Meat in general - RED  meat & White meat.  Fatty cuts of meat. Ribs, chicken wings, all processed meats as bacon, sausage, bologna, salami, fatback, hot dogs, bratwurst and packaged luncheon meats.  Dairy Whole or 2% milk, cream,   half-and-half, and cream cheese. Whole-fat or sweetened yogurt. Full-fat cheeses or blue cheese. Non-dairy creamers and whipped toppings. Processed cheese, cheese spreads, or cheese curds.  Condiments Onion and garlic salt, seasoned salt, table salt, and sea salt. Canned and packaged gravies. Worcestershire sauce. Tartar sauce. Barbecue sauce. Teriyaki sauce. Soy sauce, including reduced sodium. Steak sauce. Fish sauce. Oyster sauce. Cocktail sauce. Horseradish. Ketchup and mustard. Meat flavorings and tenderizers. Bouillon cubes. Hot sauce. Tabasco sauce. Marinades. Taco seasonings. Relishes.  Fats and Oils Butter, stick margarine, lard, shortening and bacon fat. Coconut, palm kernel, or palm oils. Regular salad dressings.  Pickles and olives. Salted popcorn and pretzels.  The items listed above may not be a complete list of foods and beverages to avoid.    

## 2015-09-15 LAB — TESTOSTERONE: TESTOSTERONE: 871 ng/dL — AB (ref 250–827)

## 2015-09-15 LAB — PSA: PSA: 0.6 ng/mL (ref <=4.00)

## 2015-09-15 LAB — MICROALBUMIN / CREATININE URINE RATIO
Creatinine, Urine: 45 mg/dL (ref 20–370)
Microalb, Ur: <0.2 mg/dL

## 2015-09-15 LAB — VITAMIN D 25 HYDROXY (VIT D DEFICIENCY, FRACTURES): VIT D 25 HYDROXY: 62 ng/mL (ref 30–100)

## 2015-09-15 LAB — INSULIN, RANDOM: Insulin: 4.2 u[IU]/mL (ref 2.0–19.6)

## 2015-09-16 ENCOUNTER — Other Ambulatory Visit: Payer: Self-pay | Admitting: Internal Medicine

## 2015-09-16 DIAGNOSIS — E349 Endocrine disorder, unspecified: Secondary | ICD-10-CM

## 2015-12-23 ENCOUNTER — Other Ambulatory Visit: Payer: Self-pay | Admitting: Physician Assistant

## 2015-12-23 DIAGNOSIS — E349 Endocrine disorder, unspecified: Secondary | ICD-10-CM

## 2016-01-10 ENCOUNTER — Other Ambulatory Visit: Payer: Self-pay | Admitting: Internal Medicine

## 2016-01-26 ENCOUNTER — Other Ambulatory Visit: Payer: Self-pay | Admitting: *Deleted

## 2016-01-26 MED ORDER — AMPHETAMINE-DEXTROAMPHETAMINE 20 MG PO TABS: ORAL_TABLET | ORAL | 0 refills | Status: DC

## 2016-03-15 ENCOUNTER — Ambulatory Visit: Payer: Self-pay | Admitting: Internal Medicine

## 2016-03-30 ENCOUNTER — Ambulatory Visit: Payer: Self-pay | Admitting: Internal Medicine

## 2016-04-07 ENCOUNTER — Encounter: Payer: Self-pay | Admitting: Internal Medicine

## 2016-04-07 ENCOUNTER — Ambulatory Visit (INDEPENDENT_AMBULATORY_CARE_PROVIDER_SITE_OTHER): Payer: 59 | Admitting: Internal Medicine

## 2016-04-07 VITALS — BP 120/80 | HR 89 | Temp 97.5°F | Resp 16 | Wt 251.4 lb

## 2016-04-07 DIAGNOSIS — E349 Endocrine disorder, unspecified: Secondary | ICD-10-CM

## 2016-04-07 DIAGNOSIS — F411 Generalized anxiety disorder: Secondary | ICD-10-CM | POA: Diagnosis not present

## 2016-04-07 DIAGNOSIS — F909 Attention-deficit hyperactivity disorder, unspecified type: Secondary | ICD-10-CM

## 2016-04-07 MED ORDER — AMPHETAMINE-DEXTROAMPHETAMINE 20 MG PO TABS: ORAL_TABLET | ORAL | 0 refills | Status: DC

## 2016-04-07 MED ORDER — ALPRAZOLAM 1 MG PO TABS: ORAL_TABLET | ORAL | 0 refills | Status: DC

## 2016-04-07 MED ORDER — TESTOSTERONE CYPIONATE 200 MG/ML IM SOLN: INTRAMUSCULAR | 3 refills | Status: DC

## 2016-04-07 NOTE — Progress Notes (Signed)
Assessment and Plan:  ADD -cont adderall  -refill given here today -cautioned to take drug holidays -drug database reviewed no evidence of abuse  Anxiety -xanax refill printed -cont wellbutrin  Testoerone def -cont testosterone injections -recent injection so will not get accurate trough level today.  Will recheck at next visit.    Continue diet and meds as discussed. Further disposition pending results of labs.  HPI 31 y.o. male  presents for follow up of ADD, Hypogonadism, and Depression.    He reports that he is currently taking his ADD Medication roughly 6 days a week.  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  He reports that he is doing well in the mornings.   He does have hypogonadism.  He does injection of testosterone himself at home.  He feels well.  He just did an injection a couple days ago.    He does have history of depression and anxiety.  He reports that he is taking his wellbutrin and xanax on an as needed basis.  He does also take the xanax to help him sleep.  He also admits some ball of the foot pain.  He reports that he has it working in his steel toed boots.  He has tried inserts and different brands of his shoes without relief.  He does not take oral medications for it.   Patient is on Vitamin D supplement.  Lab Results  Component Value Date   VD25OH 62 09/14/2015      Current Medications:  Current Outpatient Prescriptions on File Prior to Visit  Medication Sig Dispense Refill  . buPROPion (WELLBUTRIN XL) 150 MG 24 hr tablet TAKE 1 TABLET BY MOUTH EVERY MORNING. 90 tablet 1  . cholecalciferol (VITAMIN D) 1000 UNITS tablet Take 1,000 Units by mouth 3 (three) times daily.    . Multiple Vitamin (MULTIVITAMIN) tablet Take 1 tablet by mouth daily.    Marland Kitchen NEEDLE, DISP, 21 G (YALE DISP NEEDLES 21GX1") 21G X 1" MISC 1 & 1/2 "     21 G needles and 3 cc syringes 25 each 99   No current  facility-administered medications on file prior to visit.     Medical History:  Past Medical History:  Diagnosis Date  . Hypertension   . Hypogonadism male   . Vitamin D deficiency     Allergies: No Known Allergies   Review of Systems:  Review of Systems  Constitutional: Negative for chills, fever and malaise/fatigue.  HENT: Negative for congestion, ear pain and sore throat.   Eyes: Negative.   Respiratory: Negative for cough, shortness of breath and wheezing.   Cardiovascular: Negative for chest pain, palpitations and leg swelling.  Gastrointestinal: Negative for abdominal pain, blood in stool, constipation, diarrhea, heartburn and melena.  Genitourinary: Negative.   Skin: Negative.   Neurological: Negative for dizziness, sensory change, loss of consciousness and headaches.  Psychiatric/Behavioral: Negative for depression. The patient is not nervous/anxious and does not have insomnia.     Family history- Review and unchanged  Social history- Review and unchanged  Physical Exam: BP 120/80   Pulse 89   Temp 97.5 F (36.4 C)   Resp 16   Wt 251 lb 6.4 oz (114 kg)   SpO2 98%   BMI 33.63 kg/m  Wt Readings from Last 3 Encounters:  04/07/16 251 lb 6.4 oz (114 kg)  09/14/15 250 lb 9.6 oz (113.7 kg)  07/24/15 256 lb 3.2 oz (116.2 kg)    General Appearance: Well nourished well developed, in no apparent distress. Eyes: PERRLA, EOMs, conjunctiva no swelling or erythema ENT/Mouth: Ear canals normal without obstruction, swelling, erythma, discharge.  TMs normal bilaterally.  Oropharynx moist, clear, without exudate, or postoropharyngeal swelling. Neck: Supple, thyroid normal,no cervical adenopathy  Respiratory: Respiratory effort normal, Breath sounds clear A&P without rhonchi, wheeze, or rale.  No retractions, no accessory usage. Cardio: RRR with no MRGs. Brisk peripheral pulses without edema.  Abdomen: Soft, + BS,  Non tender, no guarding, rebound, hernias,  masses. Musculoskeletal: Full ROM, 5/5 strength, Normal gait Skin: Warm, dry without rashes, lesions, ecchymosis.  Neuro: Awake and oriented X 3, Cranial nerves intact. Normal muscle tone, no cerebellar symptoms. Psych: Normal affect, Insight and Judgment appropriate.    Starlyn Skeans, PA-C 9:22 AM [facility]

## 2016-05-19 ENCOUNTER — Other Ambulatory Visit: Payer: Self-pay | Admitting: Internal Medicine

## 2016-05-19 DIAGNOSIS — E349 Endocrine disorder, unspecified: Secondary | ICD-10-CM

## 2016-05-19 NOTE — Telephone Encounter (Signed)
Please call depo test

## 2016-06-06 ENCOUNTER — Other Ambulatory Visit: Payer: Self-pay | Admitting: *Deleted

## 2016-06-06 MED ORDER — AMPHETAMINE-DEXTROAMPHETAMINE 20 MG PO TABS: ORAL_TABLET | ORAL | 0 refills | Status: DC

## 2016-06-23 ENCOUNTER — Encounter: Payer: Self-pay | Admitting: *Deleted

## 2016-07-07 ENCOUNTER — Encounter: Payer: Self-pay | Admitting: *Deleted

## 2016-07-15 ENCOUNTER — Ambulatory Visit: Payer: Self-pay | Admitting: Internal Medicine

## 2016-07-17 ENCOUNTER — Other Ambulatory Visit: Payer: Self-pay | Admitting: Internal Medicine

## 2016-07-18 ENCOUNTER — Ambulatory Visit (INDEPENDENT_AMBULATORY_CARE_PROVIDER_SITE_OTHER): Payer: 59 | Admitting: Internal Medicine

## 2016-07-18 ENCOUNTER — Encounter: Payer: Self-pay | Admitting: Internal Medicine

## 2016-07-18 VITALS — BP 118/82 | HR 80 | Temp 97.3°F | Resp 16 | Ht 72.5 in | Wt 255.8 lb

## 2016-07-18 DIAGNOSIS — E782 Mixed hyperlipidemia: Secondary | ICD-10-CM

## 2016-07-18 DIAGNOSIS — F909 Attention-deficit hyperactivity disorder, unspecified type: Secondary | ICD-10-CM

## 2016-07-18 DIAGNOSIS — E349 Endocrine disorder, unspecified: Secondary | ICD-10-CM

## 2016-07-18 DIAGNOSIS — Z79899 Other long term (current) drug therapy: Secondary | ICD-10-CM | POA: Diagnosis not present

## 2016-07-18 DIAGNOSIS — R0989 Other specified symptoms and signs involving the circulatory and respiratory systems: Secondary | ICD-10-CM | POA: Diagnosis not present

## 2016-07-18 DIAGNOSIS — E559 Vitamin D deficiency, unspecified: Secondary | ICD-10-CM

## 2016-07-18 DIAGNOSIS — R7309 Other abnormal glucose: Secondary | ICD-10-CM | POA: Diagnosis not present

## 2016-07-18 LAB — CBC WITH DIFFERENTIAL/PLATELET
Basophils Absolute: 0 cells/uL (ref 0–200)
Basophils Relative: 0 %
EOS ABS: 252 {cells}/uL (ref 15–500)
Eosinophils Relative: 3 %
HCT: 48.1 % (ref 38.5–50.0)
Hemoglobin: 16.7 g/dL (ref 13.2–17.1)
Lymphocytes Relative: 19 %
Lymphs Abs: 1596 cells/uL (ref 850–3900)
MCH: 32.6 pg (ref 27.0–33.0)
MCHC: 34.7 g/dL (ref 32.0–36.0)
MCV: 93.9 fL (ref 80.0–100.0)
MONOS PCT: 7 %
MPV: 10.4 fL (ref 7.5–12.5)
Monocytes Absolute: 588 cells/uL (ref 200–950)
NEUTROS ABS: 5964 {cells}/uL (ref 1500–7800)
Neutrophils Relative %: 71 %
PLATELETS: 186 10*3/uL (ref 140–400)
RBC: 5.12 MIL/uL (ref 4.20–5.80)
RDW: 13.4 % (ref 11.0–15.0)
WBC: 8.4 10*3/uL (ref 3.8–10.8)

## 2016-07-18 LAB — TSH: TSH: 1.09 mIU/L (ref 0.40–4.50)

## 2016-07-18 MED ORDER — AMPHETAMINE-DEXTROAMPHETAMINE 20 MG PO TABS: ORAL_TABLET | ORAL | 0 refills | Status: DC

## 2016-07-18 NOTE — Progress Notes (Signed)
118/82 

## 2016-07-18 NOTE — Progress Notes (Addendum)
This very nice 31 y.o. Married WM (Oct 2017)  presents for 6 month follow up with Labile Hypertension, Hyperlipidemia, Pre-Diabetes Screening, Testosterone  and Vitamin D Deficiency. Today he reports occasional discomfort of the Rt testicle.  Patient also takes Adderall sparingly for focusing and concentration at work.      Patient is followed expectantly for labile HTN & BP has been controlled at home. Today's BP is at goal - 118/82. Patient has had no complaints of any cardiac type chest pain, palpitations, dyspnea/orthopnea/PND, dizziness, claudication, or dependent edema.     Hyperlipidemia is controlled with diet & meds. Patient denies myalgias or other med SE's. Last Lipids were at goal albeit elevated Trig's: Lab Results  Component Value Date   CHOL 181 09/14/2015   HDL 45 09/14/2015   LDLCALC 99 09/14/2015   TRIG 183 (H) 09/14/2015   CHOLHDL 4.0 09/14/2015      Also, the patient has history of Morbid Obesity (BMI 33+) and is followed expectantly for PreDiabetes and has had no symptoms of reactive hypoglycemia, diabetic polys, paresthesias or visual blurring.  Last A1c was at goal:  Lab Results  Component Value Date   HGBA1C 5.0 09/14/2015      Patient has Low T (Level 192 in 2010) and is on Testosterone replacement by injection. Further, the patient also has history of Vitamin D Deficiency ("8" in 2008) and supplements vitamin D without any suspected side-effects. Last vitamin D was at goal:  Lab Results  Component Value Date   VD25OH 62 09/14/2015   Current Outpatient Prescriptions on File Prior to Visit  Medication Sig  . ALPRAZolam  1 MG tablet Take 1/2-1 tab 2-3 times daily for anxiety   . buPROPion-XL 150 MG  TAKE 1 TAB EVERY MORNING.  . VITAMIN D 1000 UNITS  Take 1,000 Units  3 x daily.  . Multiple Vitamin  Take 1 tablet by mouth daily.  Marland Kitchen testosterone cypio 200 MG injec INJ 2 MLS IM EVERY 14 DAYS - 4 days ago  . ADDERALL 20 MG tablet Take 1/2-1 tabl 2 x daily only  if needed for ADD   No Known Allergies   PMHx:   Past Medical History:  Diagnosis Date  . Hypertension   . Hypogonadism male   . Vitamin D deficiency    Immunization History  Administered Date(s) Administered  . Influenza Whole 01/18/2010  . PPD Test 06/21/2013, 06/23/2014, 09/14/2015  . Pneumococcal Polysaccharide-23 01/18/2010  . Tdap 01/14/2009   Past Surgical History:  Procedure Laterality Date  . KNEE ARTHROSCOPY Left   . WISDOM TOOTH EXTRACTION     FHx:    Reviewed / unchanged  SHx:    Reviewed / unchanged  Systems Review:  Constitutional: Denies fever, chills, wt changes, headaches, insomnia, fatigue, night sweats, change in appetite. Eyes: Denies redness, blurred vision, diplopia, discharge, itchy, watery eyes.  ENT: Denies discharge, congestion, post nasal drip, epistaxis, sore throat, earache, hearing loss, dental pain, tinnitus, vertigo, sinus pain, snoring.  CV: Denies chest pain, palpitations, irregular heartbeat, syncope, dyspnea, diaphoresis, orthopnea, PND, claudication or edema. Respiratory: denies cough, dyspnea, DOE, pleurisy, hoarseness, laryngitis, wheezing.  Gastrointestinal: Denies dysphagia, odynophagia, heartburn, reflux, water brash, abdominal pain or cramps, nausea, vomiting, bloating, diarrhea, constipation, hematemesis, melena, hematochezia  or hemorrhoids. Genitourinary: Denies dysuria, frequency, urgency, nocturia, hesitancy, discharge, hematuria or flank pain. Musculoskeletal: Denies arthralgias, myalgias, stiffness, jt. swelling, pain, limping or strain/sprain.  Skin: Denies pruritus, rash, hives, warts, acne, eczema or change in  skin lesion(s). Neuro: No weakness, tremor, incoordination, spasms, paresthesia or pain. Psychiatric: Denies confusion, memory loss or sensory loss. Endo: Denies change in weight, skin or hair change.  Heme/Lymph: No excessive bleeding, bruising or enlarged lymph nodes.  Physical Exam  BP 118/82   Pulse 80   Temp  97.3 F (36.3 C)   Resp 16   Ht 6' 0.5" (1.842 m)   Wt 255 lb 12.8 oz (116 kg)   BMI 34.22 kg/m   Appears well nourished, well groomed  and in no distress.  Eyes: PERRLA, EOMs, conjunctiva no swelling or erythema. Sinuses: No frontal/maxillary tenderness ENT/Mouth: EAC's clear, TM's nl w/o erythema, bulging. Nares clear w/o erythema, swelling, exudates. Oropharynx clear without erythema or exudates. Oral hygiene is good. Tongue normal, non obstructing. Hearing intact.  Neck: Supple. Thyroid nl. Car 2+/2+ without bruits, nodes or JVD. Chest: Respirations nl with BS clear & equal w/o rales, rhonchi, wheezing or stridor.  Cor: Heart sounds normal w/ regular rate and rhythm without sig. murmurs, gallops, clicks or rubs. Peripheral pulses normal and equal  without edema.  Abdomen: Soft & bowel sounds normal. Non-tender w/o guarding, rebound, hernias, masses or organomegaly.  GU: No hernias , bilateral testes & cords are normal. Lymphatics: Unremarkable.  Musculoskeletal: Full ROM all peripheral extremities, joint stability, 5/5 strength and normal gait.  Skin: Warm, dry without exposed rashes, lesions or ecchymosis apparent.  Neuro: Cranial nerves intact, reflexes equal bilaterally. Sensory-motor testing grossly intact. Tendon reflexes grossly intact.  Pysch: Alert & oriented x 3.  Insight and judgement nl & appropriate. No ideations.  Assessment and Plan:  1. Labile hypertension  - Continue medication, monitor blood pressure at home.  - Continue DASH diet. Reminder to go to the ER if any CP,  SOB, nausea, dizziness, severe HA, changes vision/speech,  left arm numbness and tingling and jaw pain.  - CBC with Differential/Platelet - Magnesium - TSH  2. Abnormal glucose, screening  - Continue diet/meds, exercise,& lifestyle modifications.  - Continue monitor periodic cholesterol/liver & renal functions  - Hemoglobin A1c - Insulin, random  3. Hyperlipemia, mixed  - Continue  diet, exercise, lifestyle modifications.  - Monitor appropriate labs.  - Lipid panel - TSH  4. Vitamin D deficiency  - Continue supplementation  - VITAMIN D 25 Hydroxy   5. Testosterone deficiency  - Testosterone  6. Attention deficit hyperactivity disorder (ADHD)  - amphetamine-dextroamphetamine (ADDERALL) 20 MG tablet; Take 1/2 to 1 tablet 2 x daily only if needed for ADD  Dispense: 60 tablet; Refill: 0  7. Medication management  - CBC with Differential/Platelet - Magnesium - Lipid panel - TSH - Hemoglobin A1c - Insulin, random - VITAMIN D 25 Hydroxy  - Testosterone       Discussed  regular exercise, BP monitoring, weight control to achieve/maintain BMI less than 25 and discussed med and SE's. Recommended labs to assess and monitor clinical status with further disposition pending results of labs. Over 30 minutes of exam, counseling, chart review was performed.

## 2016-07-18 NOTE — Patient Instructions (Signed)
Recommend Adult Low Dose Aspirin or  coated  Aspirin 81 mg daily  To reduce risk of Colon Cancer 20 %,  Skin Cancer 26 % ,  Melanoma 46%  and  Pancreatic cancer 60% +++++++++++++++++++++++++ Vitamin D goal  is between 70-100.  Please make sure that you are taking your Vitamin D as directed.  It is very important as a natural anti-inflammatory  helping hair, skin, and nails, as well as reducing stroke and heart attack risk.  It helps your bones and helps with mood. It also decreases numerous cancer risks so please take it as directed.  Low Vit D is associated with a 200-300% higher risk for CANCER  and 200-300% higher risk for HEART   ATTACK  &  STROKE.   ...................................... It is also associated with higher death rate at younger ages,  autoimmune diseases like Rheumatoid arthritis, Lupus, Multiple Sclerosis.    Also many other serious conditions, like depression, Alzheimer's Dementia, infertility, muscle aches, fatigue, fibromyalgia - just to name a few. ++++++++++++++++++++ Recommend the book "The END of DIETING" by Dr Joel Fuhrman  & the book "The END of DIABETES " by Dr Joel Fuhrman At [employer] - get book & Audio CD's    Being diabetic has a  300% increased risk for heart attack, stroke, cancer, and alzheimer- type vascular dementia. It is very important that you work harder with diet by avoiding all foods that are white. Avoid white rice (brown & wild rice is OK), white potatoes (sweetpotatoes in moderation is OK), White bread or wheat bread or anything made out of white flour like bagels, donuts, rolls, buns, biscuits, cakes, pastries, cookies, pizza crust, and pasta (made from white flour & egg whites) - vegetarian pasta or spinach or wheat pasta is OK. Multigrain breads like Arnold's or Pepperidge [employer], or multigrain sandwich thins or flatbreads.  Diet, exercise and weight loss can reverse and cure diabetes in the early stages.  Diet, exercise and weight loss is  very important in the control and prevention of complications of diabetes which affects every system in your body, ie. Brain - dementia/stroke, eyes - glaucoma/blindness, heart - heart attack/heart failure, kidneys - dialysis, stomach - gastric paralysis, intestines - malabsorption, nerves - severe painful neuritis, circulation - gangrene & loss of a leg(s), and finally cancer and Alzheimers.    I recommend avoid fried & greasy foods,  sweets/candy, white rice (brown or wild rice or Quinoa is OK), white potatoes (sweet potatoes are OK) - anything made from white flour - bagels, doughnuts, rolls, buns, biscuits,white and wheat breads, pizza crust and traditional pasta made of white flour & egg white(vegetarian pasta or spinach or wheat pasta is OK).  Multi-grain bread is OK - like multi-grain flat bread or sandwich thins. Avoid alcohol in excess. Exercise is also important.    Eat all the vegetables you want - avoid meat, especially red meat and dairy - especially cheese.  Cheese is the most concentrated form of trans-fats which is the worst thing to clog up our arteries. Veggie cheese is OK which can be found in the fresh produce section at [employer] or [employer] or [employer]  +++++++++++++++++++++ DASH Eating Plan  DASH stands for "Dietary Approaches to Stop Hypertension."   The DASH eating plan is a healthy eating plan that has been shown to reduce high blood pressure (hypertension). Additional health benefits may include reducing the risk of type 2 diabetes mellitus, heart disease, and stroke. The DASH eating plan may also   help with weight loss. WHAT DO I NEED TO KNOW ABOUT THE DASH EATING PLAN? For the DASH eating plan, you will follow these general guidelines:  Choose foods with a percent daily value for sodium of less than 5% (as listed on the food label).  Use salt-free seasonings or herbs instead of table salt or sea salt.  Check with your health care provider or pharmacist before  using salt substitutes.  Eat lower-sodium products, often labeled as "lower sodium" or "no salt added."  Eat fresh foods.  Eat more vegetables, fruits, and low-fat dairy products.  Choose whole grains. Look for the word "whole" as the first word in the ingredient list.  Choose fish   Limit sweets, desserts, sugars, and sugary drinks.  Choose heart-healthy fats.  Eat veggie cheese   Eat more home-cooked food and less restaurant, buffet, and fast food.  Limit fried foods.  Cook foods using methods other than frying.  Limit canned vegetables. If you do use them, rinse them well to decrease the sodium.  When eating at a restaurant, ask that your food be prepared with less salt, or no salt if possible.                      WHAT FOODS CAN I EAT? Read Dr Joel Fuhrman's books on The End of Dieting & The End of Diabetes  Grains Whole grain or whole wheat bread. Brown rice. Whole grain or whole wheat pasta. Quinoa, bulgur, and whole grain cereals. Low-sodium cereals. Corn or whole wheat flour tortillas. Whole grain cornbread. Whole grain crackers. Low-sodium crackers.  Vegetables Fresh or frozen vegetables (raw, steamed, roasted, or grilled). Low-sodium or reduced-sodium tomato and vegetable juices. Low-sodium or reduced-sodium tomato sauce and paste. Low-sodium or reduced-sodium canned vegetables.   Fruits All fresh, canned (in natural juice), or frozen fruits.  Protein Products  All fish and seafood.  Dried beans, peas, or lentils. Unsalted nuts and seeds. Unsalted canned beans.  Dairy Low-fat dairy products, such as skim or 1% milk, 2% or reduced-fat cheeses, low-fat ricotta or cottage cheese, or plain low-fat yogurt. Low-sodium or reduced-sodium cheeses.  Fats and Oils Tub margarines without trans fats. Light or reduced-fat mayonnaise and salad dressings (reduced sodium). Avocado. Safflower, olive, or canola oils. Natural peanut or almond butter.  Other Unsalted popcorn  and pretzels. The items listed above may not be a complete list of recommended foods or beverages. Contact your dietitian for more options.  +++++++++++++++  WHAT FOODS ARE NOT RECOMMENDED? Grains/ White flour or wheat flour White bread. White pasta. White rice. Refined cornbread. Bagels and croissants. Crackers that contain trans fat.  Vegetables  Creamed or fried vegetables. Vegetables in a . Regular canned vegetables. Regular canned tomato sauce and paste. Regular tomato and vegetable juices.  Fruits Dried fruits. Canned fruit in light or heavy syrup. Fruit juice.  Meat and Other Protein Products Meat in general - RED meat & White meat.  Fatty cuts of meat. Ribs, chicken wings, all processed meats as bacon, sausage, bologna, salami, fatback, hot dogs, bratwurst and packaged luncheon meats.  Dairy Whole or 2% milk, cream, half-and-half, and cream cheese. Whole-fat or sweetened yogurt. Full-fat cheeses or blue cheese. Non-dairy creamers and whipped toppings. Processed cheese, cheese spreads, or cheese curds.  Condiments Onion and garlic salt, seasoned salt, table salt, and sea salt. Canned and packaged gravies. Worcestershire sauce. Tartar sauce. Barbecue sauce. Teriyaki sauce. Soy sauce, including reduced sodium. Steak sauce. Fish sauce. Oyster sauce. Cocktail   sauce. Horseradish. Ketchup and mustard. Meat flavorings and tenderizers. Bouillon cubes. Hot sauce. Tabasco sauce. Marinades. Taco seasonings. Relishes.  Fats and Oils Butter, stick margarine, lard, shortening and bacon fat. Coconut, palm kernel, or palm oils. Regular salad dressings.  Pickles and olives. Salted popcorn and pretzels.  The items listed above may not be a complete list of foods and beverages to avoid.  +++++++++++++++++++++++++++++  Testosterone injection What is this medicine? TESTOSTERONE (tes TOS ter one) is the main male hormone. It supports normal male development such as muscle growth, facial hair,  and deep voice. It is used in males to treat low testosterone levels. This medicine may be used for other purposes; ask your health care provider or pharmacist if you have questions. COMMON BRAND NAME(S): Andro-L.A., Aveed, Delatestryl, Depo-Testosterone, Virilon What should I tell my health care provider before I take this medicine? They need to know if you have any of these conditions: -cancer -diabetes -heart disease -kidney disease -liver disease -lung disease -prostate disease -an unusual or allergic reaction to testosterone, other medicines, foods, dyes, or preservatives -pregnant or trying to get pregnant -breast-feeding How should I use this medicine? This medicine is for injection into a muscle. It is usually given by a health care professional in a hospital or clinic setting. Contact your pediatrician regarding the use of this medicine in children. While this medicine may be prescribed for children as young as 11 years of age for selected conditions, precautions do apply. Overdosage: If you think you have taken too much of this medicine contact a poison control center or emergency room at once. NOTE: This medicine is only for you. Do not share this medicine with others. What if I miss a dose? Try not to miss a dose. Your doctor or health care professional will tell you when your next injection is due. Notify the office if you are unable to keep an appointment. What may interact with this medicine? -medicines for diabetes -medicines that treat or prevent blood clots like warfarin -oxyphenbutazone -propranolol -steroid medicines like prednisone or cortisone This list may not describe all possible interactions. Give your health care provider a list of all the medicines, herbs, non-prescription drugs, or dietary supplements you use. Also tell them if you smoke, drink alcohol, or use illegal drugs. Some items may interact with your medicine. What should I watch for while using this  medicine? Visit your doctor or health care professional for regular checks on your progress. They will need to check the level of testosterone in your blood. This medicine is only approved for use in men who have low levels of testosterone related to certain medical conditions. Heart attacks and strokes have been reported with the use of this medicine. Notify your doctor or health care professional and seek emergency treatment if you develop breathing problems; changes in vision; confusion; chest pain or chest tightness; sudden arm pain; severe, sudden headache; trouble speaking or understanding; sudden numbness or weakness of the face, arm or leg; loss of balance or coordination. Talk to your doctor about the risks and benefits of this medicine. This medicine may affect blood sugar levels. If you have diabetes, check with your doctor or health care professional before you change your diet or the dose of your diabetic medicine. Testosterone injections are not commonly used in women. Women should inform their doctor if they wish to become pregnant or think they might be pregnant. There is a potential for serious side effects to an unborn child. Talk to your  health care professional or pharmacist for more information. Talk with your doctor or health care professional about your birth control options while taking this medicine. This drug is banned from use in athletes by most athletic organizations. What side effects may I notice from receiving this medicine? Side effects that you should report to your doctor or health care professional as soon as possible: -allergic reactions like skin rash, itching or hives, swelling of the face, lips, or tongue -breast enlargement -breathing problems -changes in emotions or moods -deep or hoarse voice -irregular menstrual periods -signs and symptoms of liver injury like dark yellow or brown urine; general ill feeling or flu-like symptoms; light-colored stools; loss of  appetite; nausea; right upper belly pain; unusually weak or tired; yellowing of the eyes or skin -stomach pain -swelling of the ankles, feet, hands -too frequent or persistent erections -trouble passing urine or change in the amount of urine Side effects that usually do not require medical attention (report to your doctor or health care professional if they continue or are bothersome): -acne -change in sex drive or performance -facial hair growth -hair loss -headache This list may not describe all possible side effects. Call your doctor for medical advice about side effects. You may report side effects to FDA at 1-800-FDA-1088. Where should I keep my medicine? Keep out of the reach of children. This medicine can be abused. Keep your medicine in a safe place to protect it from theft. Do not share this medicine with anyone. Selling or giving away this medicine is dangerous and against the law. Store at room temperature between 20 and 25 degrees C (68 and 77 degrees F). Do not freeze. Protect from light. Follow the directions for the product you are prescribed. Throw away any unused medicine after the expiration date. NOTE: This sheet is a summary. It may not cover all possible information. If you have questions about this medicine, talk to your doctor, pharmacist, or health care provider.  2018 [employer] Standard (2015-03-07 07:33:55)

## 2016-07-19 LAB — TESTOSTERONE: TESTOSTERONE: 965 ng/dL — AB (ref 250–827)

## 2016-07-19 LAB — LIPID PANEL
Cholesterol: 202 mg/dL — ABNORMAL HIGH (ref <200)
HDL: 53 mg/dL (ref >40)
LDL Cholesterol: 125 mg/dL — ABNORMAL HIGH (ref <100)
Total CHOL/HDL Ratio: 3.8 Ratio (ref <5.0)
Triglycerides: 122 mg/dL (ref <150)
VLDL: 24 mg/dL (ref <30)

## 2016-07-19 LAB — VITAMIN D 25 HYDROXY (VIT D DEFICIENCY, FRACTURES): VIT D 25 HYDROXY: 40 ng/mL (ref 30–100)

## 2016-07-19 LAB — MAGNESIUM: MAGNESIUM: 2 mg/dL (ref 1.5–2.5)

## 2016-07-19 LAB — INSULIN, RANDOM: INSULIN: 7.3 u[IU]/mL (ref 2.0–19.6)

## 2016-07-19 LAB — HEMOGLOBIN A1C
Hgb A1c MFr Bld: 4.7 % (ref <5.7)
Mean Plasma Glucose: 88 mg/dL

## 2016-09-27 ENCOUNTER — Other Ambulatory Visit: Payer: Self-pay | Admitting: *Deleted

## 2016-09-27 DIAGNOSIS — F909 Attention-deficit hyperactivity disorder, unspecified type: Secondary | ICD-10-CM

## 2016-09-27 MED ORDER — AMPHETAMINE-DEXTROAMPHETAMINE 20 MG PO TABS: ORAL_TABLET | ORAL | 0 refills | Status: DC

## 2016-10-13 ENCOUNTER — Encounter: Payer: Self-pay | Admitting: Internal Medicine

## 2016-10-28 ENCOUNTER — Encounter: Payer: Self-pay | Admitting: Internal Medicine

## 2016-10-28 ENCOUNTER — Other Ambulatory Visit: Payer: Self-pay | Admitting: *Deleted

## 2016-10-28 ENCOUNTER — Ambulatory Visit (INDEPENDENT_AMBULATORY_CARE_PROVIDER_SITE_OTHER): Payer: 59 | Admitting: Internal Medicine

## 2016-10-28 VITALS — BP 132/82 | HR 76 | Temp 97.7°F | Resp 18 | Ht 72.0 in | Wt 261.4 lb

## 2016-10-28 DIAGNOSIS — R7309 Other abnormal glucose: Secondary | ICD-10-CM

## 2016-10-28 DIAGNOSIS — F909 Attention-deficit hyperactivity disorder, unspecified type: Secondary | ICD-10-CM

## 2016-10-28 DIAGNOSIS — E559 Vitamin D deficiency, unspecified: Secondary | ICD-10-CM

## 2016-10-28 DIAGNOSIS — E291 Testicular hypofunction: Secondary | ICD-10-CM

## 2016-10-28 DIAGNOSIS — Z1211 Encounter for screening for malignant neoplasm of colon: Secondary | ICD-10-CM

## 2016-10-28 DIAGNOSIS — Z1212 Encounter for screening for malignant neoplasm of rectum: Secondary | ICD-10-CM

## 2016-10-28 DIAGNOSIS — Z0000 Encounter for general adult medical examination without abnormal findings: Secondary | ICD-10-CM | POA: Diagnosis not present

## 2016-10-28 DIAGNOSIS — I10 Essential (primary) hypertension: Secondary | ICD-10-CM

## 2016-10-28 DIAGNOSIS — Z136 Encounter for screening for cardiovascular disorders: Secondary | ICD-10-CM | POA: Diagnosis not present

## 2016-10-28 DIAGNOSIS — Z111 Encounter for screening for respiratory tuberculosis: Secondary | ICD-10-CM

## 2016-10-28 DIAGNOSIS — Z79899 Other long term (current) drug therapy: Secondary | ICD-10-CM

## 2016-10-28 DIAGNOSIS — R5383 Other fatigue: Secondary | ICD-10-CM

## 2016-10-28 DIAGNOSIS — Z0001 Encounter for general adult medical examination with abnormal findings: Secondary | ICD-10-CM

## 2016-10-28 DIAGNOSIS — E349 Endocrine disorder, unspecified: Secondary | ICD-10-CM

## 2016-10-28 DIAGNOSIS — R0989 Other specified symptoms and signs involving the circulatory and respiratory systems: Secondary | ICD-10-CM

## 2016-10-28 DIAGNOSIS — E782 Mixed hyperlipidemia: Secondary | ICD-10-CM

## 2016-10-28 MED ORDER — "NEEDLE (DISP) 21G X 1"" MISC": 1 refills | Status: DC

## 2016-10-28 MED ORDER — ALPRAZOLAM 1 MG PO TABS: ORAL_TABLET | ORAL | 0 refills | Status: DC

## 2016-10-28 MED ORDER — AMPHETAMINE-DEXTROAMPHETAMINE 20 MG PO TABS: ORAL_TABLET | ORAL | 0 refills | Status: DC

## 2016-10-28 MED ORDER — TESTOSTERONE CYPIONATE 200 MG/ML IM SOLN: INTRAMUSCULAR | 2 refills | Status: DC

## 2016-10-28 MED ORDER — SYRINGE (DISPOSABLE) 3 ML MISC: 1 refills | Status: DC

## 2016-10-28 MED ORDER — BUPROPION HCL ER (XL) 150 MG PO TB24: 150.0000 mg | ORAL_TABLET | Freq: Every morning | ORAL | 1 refills | Status: DC

## 2016-10-28 NOTE — Patient Instructions (Signed)

## 2016-10-28 NOTE — Progress Notes (Signed)
[facility]   Unk Pinto, M.D.      Uvaldo Bristle. Silverio Lay, P.A.-C [facility]                [address] [place], [place] [address] Telephone [phone] Telefax [phone] Annual  Screening/Preventative Visit  & Comprehensive Evaluation & Examination     This very nice 31 y.o.  MWM presents for a Screening/Preventative Visit & comprehensive evaluation and management of multiple medical co-morbidities.  Patient has been followed for labile HTN, Prediabetes, Hyperlipidemia and Vitamin D Deficiency. Patient also has ADD and take Adderall sparingly at work.      Patient has hx/o Labile HTN  1st noted in 2011  and is followed expectantly. Patient's BP has been controlled at home.  Today's BP is at goal -132/82. Patient denies any cardiac symptoms as chest pain, palpitations, shortness of breath, dizziness or ankle swelling. BP Readings from Last 3 Encounters:  10/28/16 132/82  07/18/16 118/82  04/07/16 120/80      Patient's hyperlipidemia is not controlled with diet. Last lipids were not at goal: Lab Results  Component Value Date   CHOL 202 (H) 07/18/2016   HDL 53 07/18/2016   LDLCALC 125 (H) 07/18/2016   TRIG 122 07/18/2016   CHOLHDL 3.8 07/18/2016      Patient has Morbid Obesity (BMI 33+)  And is monitored expectantly for prediabetes  and patient denies reactive hypoglycemic symptoms, visual blurring, diabetic polys or paresthesias. Last A1c was at goal: Lab Results  Component Value Date   HGBA1C 4.7 07/18/2016       Patient has hx/o Testosterone Deficiency ("192" in 2010) and is on Depo-Testosterone inj .  Finally, patient has history of Vitamin D Deficiency of  "8" in 2008  and last vitamin D was still low: Lab Results  Component Value Date   VD25OH 40 07/18/2016   Current Outpatient Prescriptions on File Prior to Visit  Medication Sig  . VITAMIN D 1000 UNITS  Take 1,000 Units  3 x /  daily.  . Multiple Vitamin  Take 1 tab daily.    No Known Allergies Past Medical History:  Diagnosis Date  . Hypertension   . Hypogonadism male   . Vitamin D deficiency    Health Maintenance  Topic Date Due  . HIV Screening  09/26/1998  . INFLUENZA VACCINE  09/14/2016  . TETANUS/TDAP  01/15/2019   Immunization History  Administered Date(s) Administered  . Influenza Whole 01/18/2010  . PPD Test 06/21/2013, 06/23/2014, 09/14/2015  . Pneumococcal Polysaccharide-23 01/18/2010  . Tdap 01/14/2009   Past Surgical History:  Procedure Laterality Date  . KNEE ARTHROSCOPY Left   . WISDOM TOOTH EXTRACTION     Family History  Problem Relation Age of Onset  . Arthritis Mother   . Hyperlipidemia Father   . Hypertension Father   . Diabetes Father   . Gout Father    Social History   Social History  . Marital status: Married Oct Nov 15, 2015    Spouse name: N/A   Occupational History  . Works at [employer])   Social History Main Topics  . Smoking status: Former Smoker    Packs/day: 0.00    Quit date: 03/17/2012  . Smokeless tobacco: Never Used     Comment: smoke occasionally, about 1 cigarette a week  . Alcohol use 10.0 oz/week  20 Standard drinks or equivalent per week     Comment: beer-drinks 12-15 per week  . Drug use: No  . Sexual activity: Not on file    ROS Constitutional: Denies fever, chills, weight loss/gain, headaches, insomnia,  night sweats or change in appetite. Does c/o fatigue. Eyes: Denies redness, blurred vision, diplopia, discharge, itchy or watery eyes.  ENT: Denies discharge, congestion, post nasal drip, epistaxis, sore throat, earache, hearing loss, dental pain, Tinnitus, Vertigo, Sinus pain or snoring.  Cardio: Denies chest pain, palpitations, irregular heartbeat, syncope, dyspnea, diaphoresis, orthopnea, PND, claudication or edema Respiratory: denies cough, dyspnea, DOE, pleurisy, hoarseness, laryngitis or wheezing.  Gastrointestinal:  Denies dysphagia, heartburn, reflux, water brash, pain, cramps, nausea, vomiting, bloating, diarrhea, constipation, hematemesis, melena, hematochezia, jaundice or hemorrhoids Genitourinary: Denies dysuria, frequency, urgency, nocturia, hesitancy, discharge, hematuria or flank pain Musculoskeletal: Denies arthralgia, myalgia, stiffness, Jt. Swelling, pain, limp or strain/sprain. Denies Falls. Skin: Denies puritis, rash, hives, warts, acne, eczema or change in skin lesion Neuro: No weakness, tremor, incoordination, spasms, paresthesia or pain Psychiatric: Denies confusion, memory loss or sensory loss. Denies Depression. Endocrine: Denies change in weight, skin, hair change, nocturia, and paresthesia, diabetic polys, visual blurring or hyper / hypo glycemic episodes.  Heme/Lymph: No excessive bleeding, bruising or enlarged lymph nodes.  Physical Exam  BP 132/82   Pulse 76   Temp 97.7 F (36.5 C)   Resp 18   Ht 6' (1.829 m)   Wt 261 lb 6.4 oz (118.6 kg)   BMI 35.45 kg/m   General Appearance: Well nourished and well groomed and in no apparent distress.  Eyes: PERRLA, EOMs, conjunctiva no swelling or erythema, normal fundi and vessels. Sinuses: No frontal/maxillary tenderness ENT/Mouth: EACs patent / TMs  nl. Nares clear without erythema, swelling, mucoid exudates. Oral hygiene is good. No erythema, swelling, or exudate. Tongue normal, non-obstructing. Tonsils not swollen or erythematous. Hearing normal.  Neck: Supple, thyroid normal. No bruits, nodes or JVD. Respiratory: Respiratory effort normal.  BS equal and clear bilateral without rales, rhonci, wheezing or stridor. Cardio: Heart sounds are normal with regular rate and rhythm and no murmurs, rubs or gallops. Peripheral pulses are normal and equal bilaterally without edema. No aortic or femoral bruits. Chest: symmetric with normal excursions and percussion.  Abdomen: Soft, with Nl bowel sounds. Nontender, no guarding, rebound, hernias,  masses, or organomegaly.  Lymphatics: Non tender without lymphadenopathy.  Genitourinary: No hernias.Testes nl. DRE - Deferred for age. Musculoskeletal: Full ROM all peripheral extremities, joint stability, 5/5 strength, and normal gait. Skin: Warm and dry without rashes, lesions, cyanosis, clubbing or  ecchymosis.  Neuro: Cranial nerves intact, reflexes equal bilaterally. Normal muscle tone, no cerebellar symptoms. Sensation intact.  Pysch: Alert and oriented X 3 with normal affect, insight and judgment appropriate.   Assessment and Plan  1. Annual Preventative/Screening Exam   2. Labile hypertension  - EKG 12-Lead - Urinalysis, Routine w reflex microscopic - Microalbumin / creatinine urine ratio - CBC with Differential/Platelet - BASIC METABOLIC PANEL WITH GFR - Magnesium - TSH  3. Hyperlipemia, mixed  - EKG 12-Lead - Hepatic function panel - Lipid panel - TSH  4. Abnormal glucose, screening  - Hemoglobin A1c - Insulin, fasting  5. Vitamin D deficiency  - VITAMIN D 25 Hydroxy  6. Testosterone deficiency  - Testosterone  - testosterone cypionate (DEPOTESTOSTERONE CYPIONATE) 200 MG/ML injection; INJECT 2 MLS IN THE MUSCLE EVERY 14 DAYS AS DIRECTED  Dispense: 10 mL; Refill: 2  7. Attention deficit hyperactivity disorder (ADHD), unspecified  ADHD type  - ALPRAZolam (XANAX) 1 MG tablet; Take 1/2 to 1 tablet 2 to 3 times daily for anxiety or sleep.  Dispense: 90 tablet; Refill: 0  - buPROPion (WELLBUTRIN XL) 150 MG 24 hr tablet; Take 1 tablet (150 mg total) by mouth every morning.  Dispense: 90 tablet; Refill: 1  - amphetamine-dextroamphetamine (ADDERALL) 20 MG tablet; Take 1/2 to 1 tablet 2 x daily only if needed for ADD  Dispense: 60 tablet; Refill: 0  8. Screening for colorectal cancer  - POC Hemoccult Bld/Stl   9. Screening for ischemic heart disease  - EKG 12-Lead  10. Screening examination for pulmonary tuberculosis  - PPD  11. Fatigue, unspecified  type  - Iron,Total/Total Iron Binding Cap - Vitamin B12 - Testosterone - TSH  12. Medication management  - Urinalysis, Routine w reflex microscopic - Microalbumin / creatinine urine ratio - CBC with Differential/Platelet - BASIC METABOLIC PANEL WITH GFR - Hepatic function panel - Magnesium - Lipid panel - TSH - Hemoglobin A1c - Insulin, fasting - VITAMIN D 25 Hydroxy   13. Hypogonadism in male  - Testosterone - NEEDLE, DISP, 21 G (YALE DISP NEEDLES 21GX1") 21G X 1" MISC; 1 & 1/2 "     21 G needles and 3 cc syringes  Dispense: 25 each; Refill: 1       Patient was counseled in prudent diet, weight control to achieve/maintain BMI less than 25, BP monitoring, regular exercise and medications as discussed.  Discussed med effects and SE's. Routine screening labs and tests as requested with regular follow-up as recommended. Over 40 minutes of exam, counseling, chart review and high complex critical decision making was performed

## 2016-10-29 LAB — LIPID PANEL
CHOL/HDL RATIO: 3.9 (calc) (ref <5.0)
CHOLESTEROL: 200 mg/dL — AB (ref <200)
HDL: 51 mg/dL (ref >40)
LDL CHOLESTEROL (CALC): 128 mg/dL — AB
NON-HDL CHOLESTEROL (CALC): 149 mg/dL — AB (ref <130)
TRIGLYCERIDES: 99 mg/dL (ref <150)

## 2016-10-29 LAB — CBC WITH DIFFERENTIAL/PLATELET
BASOS ABS: 54 {cells}/uL (ref 0–200)
BASOS PCT: 0.7 %
EOS ABS: 254 {cells}/uL (ref 15–500)
EOS PCT: 3.3 %
HEMATOCRIT: 47.6 % (ref 38.5–50.0)
HEMOGLOBIN: 16.6 g/dL (ref 13.2–17.1)
LYMPHS ABS: 1455 {cells}/uL (ref 850–3900)
MCH: 32.2 pg (ref 27.0–33.0)
MCHC: 34.9 g/dL (ref 32.0–36.0)
MCV: 92.2 fL (ref 80.0–100.0)
MPV: 10.7 fL (ref 7.5–12.5)
Monocytes Relative: 7.3 %
NEUTROS ABS: 5375 {cells}/uL (ref 1500–7800)
Neutrophils Relative %: 69.8 %
Platelets: 202 10*3/uL (ref 140–400)
RBC: 5.16 10*6/uL (ref 4.20–5.80)
RDW: 13 % (ref 11.0–15.0)
Total Lymphocyte: 18.9 %
WBC mixed population: 562 cells/uL (ref 200–950)
WBC: 7.7 10*3/uL (ref 3.8–10.8)

## 2016-10-29 LAB — URINALYSIS, ROUTINE W REFLEX MICROSCOPIC
BILIRUBIN URINE: NEGATIVE
Glucose, UA: NEGATIVE
Hgb urine dipstick: NEGATIVE
KETONES UR: NEGATIVE
Leukocytes, UA: NEGATIVE
Nitrite: NEGATIVE
PROTEIN: NEGATIVE
Specific Gravity, Urine: 1.005 (ref 1.001–1.03)
pH: 6 (ref 5.0–8.0)

## 2016-10-29 LAB — VITAMIN D 25 HYDROXY (VIT D DEFICIENCY, FRACTURES): VIT D 25 HYDROXY: 48 ng/mL (ref 30–100)

## 2016-10-29 LAB — TSH: TSH: 0.71 mIU/L (ref 0.40–4.50)

## 2016-10-29 LAB — BASIC METABOLIC PANEL WITH GFR
BUN: 17 mg/dL (ref 7–25)
CO2: 26 mmol/L (ref 20–32)
CREATININE: 1.11 mg/dL (ref 0.60–1.35)
Calcium: 9.8 mg/dL (ref 8.6–10.3)
Chloride: 100 mmol/L (ref 98–110)
GFR, EST NON AFRICAN AMERICAN: 87 mL/min/{1.73_m2} (ref >=60)
GFR, Est African American: 101 mL/min/{1.73_m2} (ref >=60)
GLUCOSE: 88 mg/dL (ref 65–99)
Potassium: 4.4 mmol/L (ref 3.5–5.3)
SODIUM: 136 mmol/L (ref 135–146)

## 2016-10-29 LAB — VITAMIN B12: Vitamin B-12: 336 pg/mL (ref 200–1100)

## 2016-10-29 LAB — MICROALBUMIN / CREATININE URINE RATIO
CREATININE, URINE: 26 mg/dL (ref 20–320)
Microalb Creat Ratio: 12 mcg/mg creat (ref <30)
Microalb, Ur: 0.3 mg/dL

## 2016-10-29 LAB — URINE CULTURE
MICRO NUMBER:: 81017616
Result:: NO GROWTH
SPECIMEN QUALITY:: ADEQUATE

## 2016-10-29 LAB — HEPATIC FUNCTION PANEL
AG Ratio: 1.7 (calc) (ref 1.0–2.5)
ALT: 33 U/L (ref 9–46)
AST: 26 U/L (ref 10–40)
Albumin: 4.6 g/dL (ref 3.6–5.1)
Alkaline phosphatase (APISO): 69 U/L (ref 40–115)
BILIRUBIN TOTAL: 0.5 mg/dL (ref 0.2–1.2)
Bilirubin, Direct: 0.1 mg/dL (ref 0.0–0.2)
GLOBULIN: 2.7 g/dL (ref 1.9–3.7)
Indirect Bilirubin: 0.4 mg/dL (calc) (ref 0.2–1.2)
TOTAL PROTEIN: 7.3 g/dL (ref 6.1–8.1)

## 2016-10-29 LAB — IRON, TOTAL/TOTAL IRON BINDING CAP
%SAT: 31 % (calc) (ref 15–60)
Iron: 117 ug/dL (ref 50–180)
TIBC: 375 mcg/dL (calc) (ref 250–425)

## 2016-10-29 LAB — HEMOGLOBIN A1C
EAG (MMOL/L): 4.7 (calc)
Hgb A1c MFr Bld: 4.6 % of total Hgb (ref <5.7)
Mean Plasma Glucose: 85 (calc)

## 2016-10-29 LAB — TESTOSTERONE: TESTOSTERONE: 1554 ng/dL — AB (ref 250–827)

## 2016-10-29 LAB — MAGNESIUM: Magnesium: 1.8 mg/dL (ref 1.5–2.5)

## 2016-10-31 LAB — INSULIN, FASTING: Insulin: 4.2 u[IU]/mL (ref 2.0–19.6)

## 2017-03-13 ENCOUNTER — Ambulatory Visit: Payer: 59 | Admitting: Adult Health

## 2017-03-13 ENCOUNTER — Encounter: Payer: Self-pay | Admitting: Adult Health

## 2017-03-13 VITALS — BP 132/92 | HR 87 | Temp 97.3°F | Ht 72.0 in | Wt 260.0 lb

## 2017-03-13 DIAGNOSIS — S46911A Strain of unspecified muscle, fascia and tendon at shoulder and upper arm level, right arm, initial encounter: Secondary | ICD-10-CM | POA: Diagnosis not present

## 2017-03-13 DIAGNOSIS — F909 Attention-deficit hyperactivity disorder, unspecified type: Secondary | ICD-10-CM

## 2017-03-13 MED ORDER — MELOXICAM 15 MG PO TABS: ORAL_TABLET | ORAL | 1 refills | Status: DC

## 2017-03-13 MED ORDER — AMPHETAMINE-DEXTROAMPHETAMINE 20 MG PO TABS: ORAL_TABLET | ORAL | 0 refills | Status: DC

## 2017-03-13 NOTE — Patient Instructions (Signed)
Muscle Strain A muscle strain is an injury that occurs when a muscle is stretched beyond its normal length. Usually a small number of muscle fibers are torn when this happens. Muscle strain is rated in degrees. First-degree strains have the least amount of muscle fiber tearing and pain. Second-degree and third-degree strains have increasingly more tearing and pain. Usually, recovery from muscle strain takes 1-2 weeks. Complete healing takes 5-6 weeks. What are the causes? Muscle strain happens when a sudden, violent force placed on a muscle stretches it too far. This may occur with lifting, sports, or a fall. What increases the risk? Muscle strain is especially common in athletes. What are the signs or symptoms? At the site of the muscle strain, there may be:  Pain.  Bruising.  Swelling.  Difficulty using the muscle due to pain or lack of normal function.  How is this diagnosed? Your health care provider will perform a physical exam and ask about your medical history. How is this treated? Often, the best treatment for a muscle strain is resting, icing, and applying cold compresses to the injured area. Follow these instructions at home:  Use the PRICE method of treatment to promote muscle healing during the first 2-3 days after your injury. The PRICE method involves: ? Protecting the muscle from being injured again. ? Restricting your activity and resting the injured body part. ? Icing your injury. To do this, put ice in a plastic bag. Place a towel between your skin and the bag. Then, apply the ice and leave it on from 15-20 minutes each hour. After the third day, switch to moist heat packs. ? Apply compression to the injured area with a splint or elastic bandage. Be careful not to wrap it too tightly. This may interfere with blood circulation or increase swelling. ? Elevate the injured body part above the level of your heart as often as you can.  Only take over-the-counter or  prescription medicines for pain, discomfort, or fever as directed by your health care provider.  Warming up prior to exercise helps to prevent future muscle strains. Contact a health care provider if:  You have increasing pain or swelling in the injured area.  You have numbness, tingling, or a significant loss of strength in the injured area. This information is not intended to replace advice given to you by your health care provider. Make sure you discuss any questions you have with your health care provider. Document Released: 01/31/2005 Document Revised: 07/09/2015 Document Reviewed: 08/30/2012 Elsevier Interactive Patient Education  2017 Elsevier Inc.  

## 2017-03-13 NOTE — Progress Notes (Signed)
Assessment and Plan:  Ernest Richmond was seen today for shoulder pain.  Diagnoses and all orders for this visit:  Strain of right shoulder, initial encounter After injury playing basketball - maintains full active ROM with some minor perceived weakness Discussed likely minor tears to muscles, likely to resolve without intervention Patient works in a job with heavy lifting - would like to proceed with ortho referral to be established should symptoms not resolve spontaneously Advised to avoid heavy lifting for next 3-4 weeks, continue gentle ROM, avoid excessive stretching -  -     Ambulatory referral to Orthopedics -     meloxicam (MOBIC) 15 MG tablet; Take one daily with food for 2 weeks, can take with tylenol, can not take with aleve, iburpofen, then as needed daily for pain  Further disposition pending results of labs. Discussed med's effects and SE's.   Over 15 minutes of exam, counseling, chart review, and critical decision making was performed.   Future Appointments  Date Time Provider [place]  05/01/2017  8:45 AM Liane Comber, NP [facility] None  11/20/2017  9:00 AM Unk Pinto, MD [facility] None    ------------------------------------------------------------------------------------------------------------------   HPI BP (!) 132/92   Pulse 87   Temp (!) 97.3 F (36.3 C)   Ht 6' (1.829 m)   Wt 260 lb (117.9 kg)   SpO2 98%   BMI 35.26 kg/m   32 y.o.male presents for R biceps/shoulder/pectoral pain - this be 4-5 days ago - playing basketball at the time, ?hyperextended arm when he lunged for ball and fell - unsure of exact mechanism of injury. He experienced immediate sharp pain in biceps area - immediately followed by a "dull" numbing sensation. He presents with some bruising to biceps area, tender to palpation. He reports there was a "lump" in the area for 2-3 days but has resolved - likely minor hematoma.   Currently denies joint pain of neck/shoulder, denies  numbness, tingling. Current pain is 6/10 achy pain - worse with lifting (sharp). He has some perceived weakness with reaching to lift objects, pull himself over in bed, or opening doors.   He reports he has taken ibuprofen without significant relief. 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     Past Medical History:  Diagnosis Date  . Hypertension   . Hypogonadism male   . Vitamin D deficiency      No Known Allergies  Current Outpatient Medications on File Prior to Visit  Medication Sig  . ALPRAZolam (XANAX) 1 MG tablet Take 1/2 to 1 tablet 2 to 3 times daily for anxiety or sleep.  Marland Kitchen amphetamine-dextroamphetamine (ADDERALL) 20 MG tablet Take 1/2 to 1 tablet 2 x daily only if needed for ADD  . buPROPion (WELLBUTRIN XL) 150 MG 24 hr tablet Take 1 tablet (150 mg total) by mouth every morning.  . cholecalciferol (VITAMIN D) 1000 UNITS tablet Take 1,000 Units by mouth 3 (three) times daily.  . Multiple Vitamin (MULTIVITAMIN) tablet Take 1 tablet by mouth daily.  Marland Kitchen NEEDLE, DISP, 21 G (YALE DISP NEEDLES 21GX1") 21G X 1" MISC 1 & 1/2 "     21 G needles and 3 cc syringes  . Syringe, Disposable, (LUER LOCK SAFETY SYRINGES) 3 ML MISC As Directed  . testosterone cypionate (DEPOTESTOSTERONE CYPIONATE) 200 MG/ML injection INJECT 2 MLS IN THE MUSCLE EVERY 14 DAYS AS DIRECTED   No current facility-administered medications on file prior to visit.  ROS: all negative except above.   Physical Exam:  BP (!) 132/92   Pulse 87   Temp (!) 97.3 F (36.3 C)   Ht 6' (1.829 m)   Wt 260 lb (117.9 kg)   SpO2 98%   BMI 35.26 kg/m   General Appearance: Well nourished, in no apparent distress. Neck: Supple.  Respiratory: Respiratory effort normal, BS equal bilaterally without rales, rhonchi, wheezing or stridor.  Cardio: RRR with no MRGs. Brisk peripheral pulses without edema.  Musculoskeletal: Full ROM, 5/5 strength, normal gait. R  biceps/pectoral with bruising, tender to touch. Maintains full active/passive ROM without significant edema/palpable bony abnormality. Reports increased pain with flexion and extension against resistance. ?minor weakness Skin: Warm, dry without rashes, lesions. He does have some healing ecchymosis to R biceps area.  Neuro: Normal muscle tone, Sensation intact.  Psych: Awake and oriented X 3, normal affect, Insight and Judgment appropriate.     Izora Ribas, NP 11:22 AM [facility]

## 2017-03-27 ENCOUNTER — Other Ambulatory Visit: Payer: Self-pay | Admitting: Adult Health

## 2017-03-27 ENCOUNTER — Other Ambulatory Visit: Payer: Self-pay

## 2017-03-27 DIAGNOSIS — E349 Endocrine disorder, unspecified: Secondary | ICD-10-CM

## 2017-03-27 MED ORDER — TESTOSTERONE CYPIONATE 200 MG/ML IM SOLN: INTRAMUSCULAR | 2 refills | Status: DC

## 2017-03-30 ENCOUNTER — Encounter (INDEPENDENT_AMBULATORY_CARE_PROVIDER_SITE_OTHER): Payer: Self-pay | Admitting: Orthopedic Surgery

## 2017-03-30 ENCOUNTER — Ambulatory Visit (INDEPENDENT_AMBULATORY_CARE_PROVIDER_SITE_OTHER): Payer: 59 | Admitting: Orthopedic Surgery

## 2017-03-30 ENCOUNTER — Ambulatory Visit (INDEPENDENT_AMBULATORY_CARE_PROVIDER_SITE_OTHER): Payer: 59

## 2017-03-30 DIAGNOSIS — M25511 Pain in right shoulder: Secondary | ICD-10-CM | POA: Diagnosis not present

## 2017-03-30 NOTE — Addendum Note (Signed)
Addended byLaurann Montana on: 03/30/2017 09:17 AM   Modules accepted: Orders

## 2017-03-30 NOTE — Progress Notes (Signed)
Office Visit Note   Patient: Ernest Richmond           Date of Birth: 1985-03-21           MRN: [id number] Visit Date: 03/30/2017 Requested by: Liane Comber, NP [address] [place], [place] [address] PCP: Unk Pinto, MD  Subjective: Chief Complaint  Patient presents with  . Right Shoulder - Pain, Injury    HPI: [name] is a patient with right shoulder pain.  He sustained an injury playing basketball 3 weeks ago.  He was diving for basketball on the floor.  Had immediate onset of pain and numbness in the right arm.  He does not believe that he dislocated his shoulder.  He did develop bruising the next day and I reviewed that on a photo.  He reports weakness trying to open up the door as well as go with forward flexion.  He works in Estate manager/land agent.  He describes resolution of the numbness but does report some persistent weakness in the shoulder.              ROS: All systems reviewed are negative as they relate to the chief complaint within the history of present illness.  Patient denies  fevers or chills.   Assessment & Plan: Visit Diagnoses:  1. Acute pain of right shoulder     Plan: Impression is right shoulder injury with subscap weakness on exam.  Ultrasound examination is not conclusive for subscapularis rupture.  I think with his immediate paresthesias in the right arm as well as his weakness on belly press test today that we need MRI arthrogram to evaluate subscapularis tearing.  I will see him back after that study.  Follow-Up Instructions: No Follow-up on file.   Orders:  Orders Placed This Encounter  Procedures  . XR Shoulder Right   No orders of the defined types were placed in this encounter.     Procedures: No procedures performed   Clinical Data: No additional findings.  Objective: Vital Signs: There were no vitals taken for this visit.  Physical Exam:   Constitutional: Patient appears well-developed HEENT:  Head:  Normocephalic Eyes:EOM are normal Neck: Normal range of motion Cardiovascular: Normal rate Pulmonary/chest: Effort normal Neurologic: Patient is alert Skin: Skin is warm Psychiatric: Patient has normal mood and affect    Ortho Exam: Orthopedic exam demonstrates good cervical spine range of motion.  5 out of 5 grip EPL FPL interosseous wrist flexion wrist extension biceps triceps and deltoid strength.  On that right shoulder he is got very good external rotation strength which is symmetric compared to the left as well as supraspinatus strength symmetric to the left.  Belly press test shows 4- out of 5 strength on the right compared to 5+ out of 5 on the left.  Examination with ultrasound demonstrates possibility of injury partial versus full-thickness to the subscap tendon.  Biceps appears to be located in the groove.  Specialty Comments:  No specialty comments available.  Imaging: Xr Shoulder Right  Result Date: 03/30/2017 AP axillary outlet right shoulder reviewed.  Narrowing of the [facility] joint is noted.  There is no fracture or dislocation.  Visualized lung fields clear.  Acromiohumeral distance maintained.  No glenohumeral arthritis present    PMFS History: Patient Active Problem List   Diagnosis Date Noted  . Encounter for general adult medical examination with abnormal findings 09/14/2015  . Fatigue 09/14/2015  . Abnormal LFTs 06/24/2014  . Morbid obesity (BMI 33.68) 06/23/2014  .  Alcohol consumption heavy 02/18/2014  . Abnormal glucose, screening 12/24/2013  . Hyperlipidemia 06/21/2013  . Medication management 06/21/2013  . ADD (attention deficit disorder) 06/21/2013  . Essential hypertension 03/14/2013  . Testosterone deficiency 03/14/2013  . Vitamin D deficiency 03/14/2013   Past Medical History:  Diagnosis Date  . Hypertension   . Hypogonadism male   . Vitamin D deficiency     Family History  Problem Relation Age of Onset  . Arthritis Mother   . Hyperlipidemia  Father   . Hypertension Father   . Diabetes Father   . Gout Father     Past Surgical History:  Procedure Laterality Date  . KNEE ARTHROSCOPY Left   . WISDOM TOOTH EXTRACTION     Social History   Occupational History  . Not on file  Tobacco Use  . Smoking status: Former Smoker    Packs/day: 0.00    Last attempt to quit: 03/17/2012    Years since quitting: 5.0  . Smokeless tobacco: Never Used  . Tobacco comment: smoke occasionally, about 1 cigarette a week  Substance and Sexual Activity  . Alcohol use: Yes    Alcohol/week: 10.0 oz    Types: 20 Standard drinks or equivalent per week    Comment: beer-drinks 12-15 per week  . Drug use: No  . Sexual activity: Not on file

## 2017-04-03 ENCOUNTER — Telehealth (INDEPENDENT_AMBULATORY_CARE_PROVIDER_SITE_OTHER): Payer: Self-pay | Admitting: Orthopedic Surgery

## 2017-04-03 NOTE — Telephone Encounter (Signed)
Patient has a $5,000 deductible for his insurance and was wondering if there is something other than the MRI that he can do. He is scheduled for the MRI tomorrow so please advise today if possible # [phone]

## 2017-04-03 NOTE — Telephone Encounter (Signed)
We tried ultrasound and it was not conclusive about rotator cuff rupture.  With the weakness that he is having I think MRI scan is indicated due to his young age.  Also that particular type of rotator cuff tear can be difficult to repair if it retracts too much.  Therefore I think even though he has a high deductible it is worth considering getting that scan just to know what is going on in the shoulder particularly in light of the bruising that he sustained.  In addition to subscap partial versus complete rupture pectoralis partial versus complete rupture is also a consideration

## 2017-04-03 NOTE — Telephone Encounter (Signed)
Please advise. Thanks.  

## 2017-04-03 NOTE — Telephone Encounter (Signed)
IC s/w patient and advised  

## 2017-04-04 ENCOUNTER — Ambulatory Visit (HOSPITAL_COMMUNITY)
Admission: RE | Admit: 2017-04-04 | Discharge: 2017-04-04 | Disposition: A | Discharge status: Home / Self Care | Payer: 59 | Source: Ambulatory Visit | Attending: Orthopedic Surgery | Admitting: Orthopedic Surgery

## 2017-04-04 DIAGNOSIS — M25511 Pain in right shoulder: Secondary | ICD-10-CM

## 2017-04-04 DIAGNOSIS — M19011 Primary osteoarthritis, right shoulder: Secondary | ICD-10-CM | POA: Insufficient documentation

## 2017-04-04 MED ORDER — LIDOCAINE HCL (PF) 1 % IJ SOLN
INTRAMUSCULAR | Status: AC
  Administered 2017-04-04: 10 mL via INTRADERMAL
  Filled 2017-04-04: qty 10

## 2017-04-04 MED ORDER — IOPAMIDOL (ISOVUE-M 200) INJECTION 41%
INTRAMUSCULAR | Status: AC
  Administered 2017-04-04: 15 mL via INTRA_ARTICULAR
  Filled 2017-04-04: qty 10

## 2017-04-04 MED ORDER — IOPAMIDOL (ISOVUE-M 200) INJECTION 41%
20.0000 mL | Freq: Once | INTRAMUSCULAR | Status: AC
  Administered 2017-04-04: 15 mL via INTRA_ARTICULAR

## 2017-04-04 MED ORDER — GADOBENATE DIMEGLUMINE 529 MG/ML IV SOLN
5.0000 mL | Freq: Once | INTRAVENOUS | Status: AC | PRN
  Administered 2017-04-04: 0.05 mL via INTRA_ARTICULAR

## 2017-04-04 MED ORDER — LIDOCAINE HCL (PF) 1 % IJ SOLN
10.0000 mL | Freq: Once | INTRAMUSCULAR | Status: AC
  Administered 2017-04-04: 10 mL via INTRADERMAL

## 2017-04-11 ENCOUNTER — Other Ambulatory Visit: Payer: Self-pay

## 2017-04-11 DIAGNOSIS — S46911A Strain of unspecified muscle, fascia and tendon at shoulder and upper arm level, right arm, initial encounter: Secondary | ICD-10-CM

## 2017-04-11 MED ORDER — MELOXICAM 15 MG PO TABS: ORAL_TABLET | ORAL | 1 refills | Status: DC

## 2017-04-12 ENCOUNTER — Ambulatory Visit (INDEPENDENT_AMBULATORY_CARE_PROVIDER_SITE_OTHER): Payer: 59 | Admitting: Orthopedic Surgery

## 2017-04-12 ENCOUNTER — Other Ambulatory Visit: Payer: Self-pay

## 2017-04-12 ENCOUNTER — Encounter (INDEPENDENT_AMBULATORY_CARE_PROVIDER_SITE_OTHER): Payer: Self-pay | Admitting: Orthopedic Surgery

## 2017-04-12 DIAGNOSIS — M25511 Pain in right shoulder: Secondary | ICD-10-CM | POA: Diagnosis not present

## 2017-04-12 DIAGNOSIS — S46911A Strain of unspecified muscle, fascia and tendon at shoulder and upper arm level, right arm, initial encounter: Secondary | ICD-10-CM

## 2017-04-12 NOTE — Progress Notes (Signed)
Office Visit Note   Patient: Ernest Richmond           Date of Birth: October 09, 1985           MRN: [id number] Visit Date: 04/12/2017 Requested by: Unk Pinto, [address] [place], [place] [address] PCP: Unk Pinto, MD  Subjective: Chief Complaint  Patient presents with  . Right Shoulder - Follow-up    HPI: Ernest Richmond is a patient who presents for follow-up of right shoulder MRI scan.  Scan shows moderate AC joint arthritis but intact rotator cuff and labrum.  Did have a fall.  Still feels a little bit weak in the shoulder.              ROS: All systems reviewed are negative as they relate to the chief complaint within the history of present illness.  Patient denies  fevers or chills.   Assessment & Plan: Visit Diagnoses:  1. Acute pain of right shoulder     Plan: Impression is acute pain right shoulder following fall with no definite labral or rotator cuff pathology.  Some AC joint arthritis is present.  We discussed management strategies in terms of exercise to avoid aggravating that pre-existing arthritis.  No operative indication at this time.  Follow-up with me as needed.  Follow-Up Instructions: Return if symptoms worsen or fail to improve.   Orders:  No orders of the defined types were placed in this encounter.  No orders of the defined types were placed in this encounter.     Procedures: No procedures performed   Clinical Data: No additional findings.  Objective: Vital Signs: There were no vitals taken for this visit.  Physical Exam:   Constitutional: Patient appears well-developed HEENT:  Head: Normocephalic Eyes:EOM are normal Neck: Normal range of motion Cardiovascular: Normal rate Pulmonary/chest: Effort normal Neurologic: Patient is alert Skin: Skin is warm Psychiatric: Patient has normal mood and affect    Ortho Exam: Orthopedic exam demonstrates full active and passive range of motion of the shoulder.  Subscap strength is  improved today compared to last clinic visit on the right-hand side.  No discrete AC joint tenderness right versus left.  Range of motion is full.  No other masses lymphadenopathy or skin changes noted in the shoulder girdle region.  Specialty Comments:  No specialty comments available.  Imaging: No results found.   PMFS History: Patient Active Problem List   Diagnosis Date Noted  . Encounter for general adult medical examination with abnormal findings 09/14/2015  . Fatigue 09/14/2015  . Abnormal LFTs 06/24/2014  . Morbid obesity (BMI 33.68) 06/23/2014  . Alcohol consumption heavy 02/18/2014  . Abnormal glucose, screening 12/24/2013  . Hyperlipidemia 06/21/2013  . Medication management 06/21/2013  . ADD (attention deficit disorder) 06/21/2013  . Essential hypertension 03/14/2013  . Testosterone deficiency 03/14/2013  . Vitamin D deficiency 03/14/2013   Past Medical History:  Diagnosis Date  . Hypertension   . Hypogonadism male   . Vitamin D deficiency     Family History  Problem Relation Age of Onset  . Arthritis Mother   . Hyperlipidemia Father   . Hypertension Father   . Diabetes Father   . Gout Father     Past Surgical History:  Procedure Laterality Date  . KNEE ARTHROSCOPY Left   . WISDOM TOOTH EXTRACTION     Social History   Occupational History  . Not on file  Tobacco Use  . Smoking status: Former Smoker    Packs/day: 0.00  Last attempt to quit: 03/17/2012    Years since quitting: 5.0  . Smokeless tobacco: Never Used  . Tobacco comment: smoke occasionally, about 1 cigarette a week  Substance and Sexual Activity  . Alcohol use: Yes    Alcohol/week: 10.0 oz    Types: 20 Standard drinks or equivalent per week    Comment: beer-drinks 12-15 per week  . Drug use: No  . Sexual activity: Not on file

## 2017-04-13 ENCOUNTER — Other Ambulatory Visit: Payer: Self-pay | Admitting: Physician Assistant

## 2017-04-13 DIAGNOSIS — S46911A Strain of unspecified muscle, fascia and tendon at shoulder and upper arm level, right arm, initial encounter: Secondary | ICD-10-CM

## 2017-04-13 MED ORDER — MELOXICAM 15 MG PO TABS: ORAL_TABLET | ORAL | 1 refills | Status: DC

## 2017-04-13 NOTE — Progress Notes (Signed)
Future Appointments  Date Time Provider Frederica  05/01/2017  8:45 AM Liane Comber, NP [facility] None  11/20/2017  9:00 AM Unk Pinto, MD [facility] None

## 2017-04-19 ENCOUNTER — Other Ambulatory Visit: Payer: Self-pay

## 2017-04-19 DIAGNOSIS — S46911A Strain of unspecified muscle, fascia and tendon at shoulder and upper arm level, right arm, initial encounter: Secondary | ICD-10-CM

## 2017-04-19 MED ORDER — MELOXICAM 15 MG PO TABS: ORAL_TABLET | ORAL | 1 refills | Status: DC

## 2017-04-25 ENCOUNTER — Other Ambulatory Visit: Payer: Self-pay | Admitting: Internal Medicine

## 2017-04-25 DIAGNOSIS — F909 Attention-deficit hyperactivity disorder, unspecified type: Secondary | ICD-10-CM

## 2017-04-25 DIAGNOSIS — E349 Endocrine disorder, unspecified: Secondary | ICD-10-CM

## 2017-04-25 MED ORDER — TESTOSTERONE CYPIONATE 200 MG/ML IM SOLN: INTRAMUSCULAR | 2 refills | Status: DC

## 2017-04-25 MED ORDER — ALPRAZOLAM 1 MG PO TABS: ORAL_TABLET | ORAL | 0 refills | Status: DC

## 2017-04-30 NOTE — Progress Notes (Deleted)
FOLLOW UP  Assessment and Plan:   Hypertension Well controlled with current medications  Monitor blood pressure at home; patient to call if consistently greater than 130/80 Continue DASH diet.   Reminder to go to the ER if any CP, SOB, nausea, dizziness, severe HA, changes vision/speech, left arm numbness and tingling and jaw pain.  Cholesterol Currently above goal with lifestyle modification only Continue low cholesterol diet and exercise.  Check lipid panel.   Obesity with co morbidities Long discussion about weight loss, diet, and exercise Recommended diet heavy in fruits and veggies and low in animal meats, cheeses, and dairy products, appropriate calorie intake Discussed ideal weight for height (below ***) and initial weight goal (***) Patient will work on *** Will follow up in 3 months  Vitamin D Def Below goal at last visit; continue supplementation to maintain goal of 70-100 Check Vit D level  Depression/anxiety Continue medications  Lifestyle discussed: diet/exerise, sleep hygiene, stress management, hydration  ADD Continue medications Helps with focus, no AE's. The patient was counseled on the addictive nature of the medication and was encouraged to take drug holidays when not needed.   Hypogonadism - continue replacement therapy, check testosterone levels as needed.   Continue diet and meds as discussed. Further disposition pending results of labs. Discussed med's effects and SE's.   Over 30 minutes of exam, counseling, chart review, and critical decision making was performed.   Future Appointments  Date Time Provider Mabel  05/01/2017  8:45 AM Liane Comber, NP [facility] None  05/15/2017  4:30 PM Unk Pinto, MD [facility] None  11/20/2017  9:00 AM Unk Pinto, MD [facility] None    ----------------------------------------------------------------------------------------------------------------------  HPI 32 y.o. male  presents for  6 month follow up on hypertension, cholesterol, depression/anxiety, ADD, obesity and vitamin D deficiency.   he has a diagnosis of depression/anxiety and is currently on wellbutrin 150 mg daily, xanax 0.5-1 mg TID PRN, reports symptoms are*** well controlled on current regimen. he   Patient is on an ADD medication, he states that the medication is helping and he denies any adverse reactions.   BMI is There is no height or weight on file to calculate BMI., he {HAS HAS ZJQ:73419} been working on diet and exercise. Wt Readings from Last 3 Encounters:  03/13/17 260 lb (117.9 kg)  10/28/16 261 lb 6.4 oz (118.6 kg)  07/18/16 255 lb 12.8 oz (116 kg)   His blood pressure {HAS HAS NOT:18834} been controlled at home, today their BP is    He {DOES_DOES FXT:02409} workout. He denies chest pain, shortness of breath, dizziness.   He is not on cholesterol medication and denies myalgias. His cholesterol is not at goal. The cholesterol last visit was:   Lab Results  Component Value Date   CHOL 200 (H) 10/28/2016   HDL 51 10/28/2016   LDLCALC 128 (H) 10/28/2016   TRIG 99 10/28/2016   CHOLHDL 3.9 10/28/2016   Last A1C in the office was:  Lab Results  Component Value Date   HGBA1C 4.6 10/28/2016   Patient is on Vitamin D supplement but remained below goal of 70 at last check:   Lab Results  Component Value Date   VD25OH 48 10/28/2016     He has a history of testosterone deficiency and is on testosterone replacement. He states that the testosterone helps with his energy, libido, muscle mass. Lab Results  Component Value Date   TESTOSTERONE 1,554 (H) 10/28/2016      Current Medications:  Current  Outpatient Medications on File Prior to Visit  Medication Sig  . ALPRAZolam (XANAX) 1 MG tablet Take 1/2 to 1 tablet 2 to 3 x / day ONLY if needed for Anxiety Attack or Sleep  & please try to limit to 5 days /week to avoid addiction  . amphetamine-dextroamphetamine (ADDERALL) 20 MG tablet Take 1/2 to  1 tablet 2 x daily only if needed for ADD  . buPROPion (WELLBUTRIN XL) 150 MG 24 hr tablet Take 1 tablet (150 mg total) by mouth every morning.  . cholecalciferol (VITAMIN D) 1000 UNITS tablet Take 1,000 Units by mouth 3 (three) times daily.  . meloxicam (MOBIC) 15 MG tablet Take one daily with food for 2 weeks, can take with tylenol, can not take with aleve, iburpofen, then as needed daily for pain  . Multiple Vitamin (MULTIVITAMIN) tablet Take 1 tablet by mouth daily.  Marland Kitchen NEEDLE, DISP, 21 G (YALE DISP NEEDLES 21GX1") 21G X 1" MISC 1 & 1/2 "     21 G needles and 3 cc syringes  . Syringe, Disposable, (LUER LOCK SAFETY SYRINGES) 3 ML MISC As Directed  . testosterone cypionate (DEPOTESTOSTERONE CYPIONATE) 200 MG/ML injection INJECT 2 MLS IN THE MUSCLE EVERY 14 DAYS AS DIRECTED   No current facility-administered medications on file prior to visit.      Allergies: No Known Allergies   Medical History:  Past Medical History:  Diagnosis Date  . Hypertension   . Hypogonadism male   . Vitamin D deficiency    Family history- Reviewed and unchanged Social history- Reviewed and unchanged   Review of Systems:  Review of Systems  Constitutional: Negative for malaise/fatigue and weight loss.  HENT: Negative for hearing loss and tinnitus.   Eyes: Negative for blurred vision and double vision.  Respiratory: Negative for cough, shortness of breath and wheezing.   Cardiovascular: Negative for chest pain, palpitations, orthopnea, claudication and leg swelling.  Gastrointestinal: Negative for abdominal pain, blood in stool, constipation, diarrhea, heartburn, melena, nausea and vomiting.  Genitourinary: Negative.   Musculoskeletal: Negative for joint pain and myalgias.  Skin: Negative for rash.  Neurological: Negative for dizziness, tingling, sensory change, weakness and headaches.  Endo/Heme/Allergies: Negative for polydipsia.  Psychiatric/Behavioral: Negative.   All other systems reviewed and are  negative.     Physical Exam: There were no vitals taken for this visit. Wt Readings from Last 3 Encounters:  03/13/17 260 lb (117.9 kg)  10/28/16 261 lb 6.4 oz (118.6 kg)  07/18/16 255 lb 12.8 oz (116 kg)   General Appearance: Well nourished, in no apparent distress. Eyes: PERRLA, EOMs, conjunctiva no swelling or erythema Sinuses: No Frontal/maxillary tenderness ENT/Mouth: Ext aud canals clear, TMs without erythema, bulging. No erythema, swelling, or exudate on post pharynx.  Tonsils not swollen or erythematous. Hearing normal.  Neck: Supple, thyroid normal.  Respiratory: Respiratory effort normal, BS equal bilaterally without rales, rhonchi, wheezing or stridor.  Cardio: RRR with no MRGs. Brisk peripheral pulses without edema.  Abdomen: Soft, + BS.  Non tender, no guarding, rebound, hernias, masses. Lymphatics: Non tender without lymphadenopathy.  Musculoskeletal: Full ROM, 5/5 strength, {PSY - GAIT AND STATION:22860} gait Skin: Warm, dry without rashes, lesions, ecchymosis.  Neuro: Cranial nerves intact. No cerebellar symptoms.  Psych: Awake and oriented X 3, normal affect, Insight and Judgment appropriate.    Izora Ribas, NP 8:44 AM [facility]

## 2017-05-01 ENCOUNTER — Ambulatory Visit: Payer: Self-pay | Admitting: Adult Health

## 2017-05-02 ENCOUNTER — Other Ambulatory Visit: Payer: Self-pay

## 2017-05-02 DIAGNOSIS — Z1211 Encounter for screening for malignant neoplasm of colon: Secondary | ICD-10-CM

## 2017-05-02 DIAGNOSIS — Z1212 Encounter for screening for malignant neoplasm of rectum: Secondary | ICD-10-CM

## 2017-05-02 LAB — POC HEMOCCULT BLD/STL (HOME/3-CARD/SCREEN)
Card #2 Fecal Occult Blod, POC: NEGATIVE
Card #3 Fecal Occult Blood, POC: NEGATIVE
Fecal Occult Blood, POC: NEGATIVE

## 2017-05-02 NOTE — Progress Notes (Signed)
FOLLOW UP  Assessment and Plan:   Hypertension Starting telmisartan -40 mg daily initially, increase to 80 mg as needed to maintain BP under 130/80  Monitor blood pressure at home; patient to call if consistently greater than 130/80 Continue DASH diet.   Reminder to go to the ER if any CP, SOB, nausea, dizziness, severe HA, changes vision/speech, left arm numbness and tingling and jaw pain.  Cholesterol Currently above goal with lifestyle modification only Continue low cholesterol diet and exercise.  Check lipid panel.   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 Can contact back at any point to initiate this.  Lifestyle discussed: diet/exerise, sleep hygiene, stress management, hydration  ADD Continue medications Helps with focus, no AE's. The patient was counseled on the addictive nature of the medication and was encouraged to take drug holidays when not needed.   Hypogonadism - continue replacement therapy, check testosterone levels as needed.   Continue diet and meds as discussed. Further disposition pending results of labs. Discussed med's effects and SE's.   Over 30 minutes of exam, counseling, chart review, and critical decision making was performed.   Future Appointments  Date Time Provider Richardson  05/15/2017  4:30 PM Unk Pinto, MD [facility] None  11/20/2017  9:00 AM Unk Pinto, MD [facility] None    ----------------------------------------------------------------------------------------------------------------------  HPI 32 y.o. male  presents for 6 month follow up on hypertension, cholesterol, depression/anxiety, ADD, obesity and vitamin D deficiency.   he has a diagnosis of depression/anxiety and is currently on wellbutrin 150 mg daily, xanax 0.5-1 mg TID PRN, reports symptoms are well controlled on current regimen. he currently takes a xanax once daily 5/7 days. He has been under increased stress at work due to disagreements.   Patient is on an ADD medication, he states that the medication is helping and he denies any adverse reactions. 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   BMI is Body mass index is 34.58 kg/m., he has not been working on diet and exercise. Wt Readings from Last 3 Encounters:  05/04/17 255 lb (115.7 kg)  03/13/17 260 lb (117.9 kg)  10/28/16 261 lb 6.4 oz (118.6 kg)   Today their BP is BP: 140/90  He does not workout. He denies chest pain, shortness of breath, dizziness.   He is not on cholesterol medication and denies myalgias. His cholesterol is not at goal. The cholesterol last visit was:   Lab Results  Component Value Date   CHOL 200 (H) 10/28/2016   HDL 51 10/28/2016   LDLCALC 128 (H) 10/28/2016   TRIG 99 10/28/2016   CHOLHDL 3.9 10/28/2016   Last A1C in the office was:  Lab Results  Component Value Date   HGBA1C 4.6 10/28/2016   Patient is on Vitamin D supplement but remained below goal of 70 at last check:   Lab Results  Component Value Date  VD25OH 48 10/28/2016     He has a history of testosterone deficiency and is on testosterone replacement. He states that the testosterone helps with his energy, libido, muscle mass. Lab Results  Component Value Date   TESTOSTERONE 1,554 (H) 10/28/2016      Current Medications:  Current Outpatient Medications on  File Prior to Visit  Medication Sig  . ALPRAZolam (XANAX) 1 MG tablet Take 1/2 to 1 tablet 2 to 3 x / day ONLY if needed for Anxiety Attack or Sleep  & please try to limit to 5 days /week to avoid addiction  . amphetamine-dextroamphetamine (ADDERALL) 20 MG tablet Take 1/2 to 1 tablet 2 x daily only if needed for ADD  . buPROPion (WELLBUTRIN XL) 150 MG 24 hr tablet Take 1 tablet (150 mg total) by mouth every morning.  . cholecalciferol (VITAMIN D) 1000 UNITS tablet Take 6,000 Units by mouth daily.   . Multiple Vitamin (MULTIVITAMIN) tablet Take 1 tablet by mouth daily.  Marland Kitchen NEEDLE, DISP, 21 G (YALE DISP NEEDLES 21GX1") 21G X 1" MISC 1 & 1/2 "     21 G needles and 3 cc syringes  . Syringe, Disposable, (LUER LOCK SAFETY SYRINGES) 3 ML MISC As Directed  . testosterone cypionate (DEPOTESTOSTERONE CYPIONATE) 200 MG/ML injection INJECT 2 MLS IN THE MUSCLE EVERY 14 DAYS AS DIRECTED  . meloxicam (MOBIC) 15 MG tablet Take one daily with food for 2 weeks, can take with tylenol, can not take with aleve, iburpofen, then as needed daily for pain (Patient not taking: Reported on 05/04/2017)   No current facility-administered medications on file prior to visit.      Allergies: No Known Allergies   Medical History:  Past Medical History:  Diagnosis Date  . Hypertension   . Hypogonadism male   . Vitamin D deficiency    Family history- Reviewed and unchanged Social history- Reviewed and unchanged   Review of Systems:  Review of Systems  Constitutional: Negative for malaise/fatigue and weight loss.  HENT: Negative for hearing loss and tinnitus.   Eyes: Negative for blurred vision and double vision.  Respiratory: Negative for cough, shortness of breath and wheezing.   Cardiovascular: Negative for chest pain, palpitations, orthopnea, claudication and leg swelling.  Gastrointestinal: Negative for abdominal pain, blood in stool, constipation, diarrhea, heartburn, melena, nausea and vomiting.   Genitourinary: Negative.   Musculoskeletal: Negative for joint pain and myalgias.  Skin: Negative for rash.  Neurological: Negative for dizziness, tingling, sensory change, weakness and headaches.  Endo/Heme/Allergies: Negative for polydipsia.  Psychiatric/Behavioral: Positive for substance abuse (Excessive alcohol use). Negative for depression, memory loss and suicidal ideas. The patient is nervous/anxious and has insomnia.   All other systems reviewed and are negative.     Physical Exam: BP 140/90   Pulse 97   Temp 97.9 F (36.6 C)   Ht 6' (1.829 m)   Wt 255 lb (115.7 kg)   SpO2 99%   BMI 34.58 kg/m  Wt Readings from Last 3 Encounters:  05/04/17 255 lb (115.7 kg)  03/13/17 260 lb (117.9 kg)  10/28/16 261 lb 6.4 oz (118.6 kg)   General Appearance: Well nourished, in no apparent distress. Eyes: PERRLA, EOMs, conjunctiva no swelling or erythema Sinuses: No Frontal/maxillary tenderness ENT/Mouth: Ext aud canals clear, TMs without erythema, bulging. No erythema, swelling, or exudate on post pharynx.  Tonsils not swollen or erythematous. Hearing normal.  Neck: Supple, thyroid normal.  Respiratory: Respiratory effort normal, BS equal bilaterally without rales, rhonchi, wheezing or  stridor.  Cardio: RRR with no MRGs. Brisk peripheral pulses without edema.  Abdomen: Soft, + BS.  Non tender, no guarding, rebound, hernias, masses. Lymphatics: Non tender without lymphadenopathy.  Musculoskeletal: Full ROM, 5/5 strength, Normal gait Skin: Warm, dry without rashes, lesions, ecchymosis.  Neuro: Cranial nerves intact. No cerebellar symptoms.  Psych: Awake and oriented X 3, normal affect, Insight and Judgment appropriate.    Izora Ribas, NP 4:18 PM [facility]

## 2017-05-04 ENCOUNTER — Ambulatory Visit: Payer: 59 | Admitting: Adult Health

## 2017-05-04 ENCOUNTER — Encounter: Payer: Self-pay | Admitting: Adult Health

## 2017-05-04 VITALS — BP 140/90 | HR 97 | Temp 97.9°F | Ht 72.0 in | Wt 255.0 lb

## 2017-05-04 DIAGNOSIS — E559 Vitamin D deficiency, unspecified: Secondary | ICD-10-CM | POA: Diagnosis not present

## 2017-05-04 DIAGNOSIS — E349 Endocrine disorder, unspecified: Secondary | ICD-10-CM | POA: Diagnosis not present

## 2017-05-04 DIAGNOSIS — R7989 Other specified abnormal findings of blood chemistry: Secondary | ICD-10-CM

## 2017-05-04 DIAGNOSIS — I10 Essential (primary) hypertension: Secondary | ICD-10-CM

## 2017-05-04 DIAGNOSIS — F988 Other specified behavioral and emotional disorders with onset usually occurring in childhood and adolescence: Secondary | ICD-10-CM

## 2017-05-04 DIAGNOSIS — E782 Mixed hyperlipidemia: Secondary | ICD-10-CM | POA: Diagnosis not present

## 2017-05-04 DIAGNOSIS — F909 Attention-deficit hyperactivity disorder, unspecified type: Secondary | ICD-10-CM | POA: Diagnosis not present

## 2017-05-04 DIAGNOSIS — R945 Abnormal results of liver function studies: Secondary | ICD-10-CM

## 2017-05-04 DIAGNOSIS — E291 Testicular hypofunction: Secondary | ICD-10-CM | POA: Diagnosis not present

## 2017-05-04 DIAGNOSIS — Z79899 Other long term (current) drug therapy: Secondary | ICD-10-CM | POA: Diagnosis not present

## 2017-05-04 MED ORDER — TELMISARTAN 80 MG PO TABS: ORAL_TABLET | ORAL | 1 refills | Status: DC

## 2017-05-04 NOTE — Addendum Note (Signed)
Addended by: Chancy Hurter on: 05/04/2017 04:46 PM   Modules accepted: Orders

## 2017-05-04 NOTE — Patient Instructions (Addendum)
Monitor your blood pressure at home, please keep a record and bring that in with you to your next office visit.   Go to the ER if any CP, SOB, nausea, dizziness, severe HA, changes vision/speech  Due to a recent study, SPRINT, we have changed our goal for the systolic or top blood pressure number. Ideally we want your top number at 120.  In the [facility] Trial, 5000 people were randomized to a goal BP of 120 and 5000 people were randomized to a goal BP of less than 140. The patients with the goal BP at 120 had LESS DEMENTIA, LESS HEART ATTACKS, AND LESS STROKES, AS WELL AS OVERALL DECREASED MORTALITY OR DEATH RATE.   If you are willing, our goal BP is the top number of 120.  Your most recent BP: BP: 140/90   Take your medications faithfully as instructed. Maintain a healthy weight. Get at least 150 minutes of aerobic exercise per week. Minimize salt intake. Minimize alcohol intake  DASH Eating Plan DASH stands for "Dietary Approaches to Stop Hypertension." The DASH eating plan is a healthy eating plan that has been shown to reduce high blood pressure (hypertension). Additional health benefits may include reducing the risk of type 2 diabetes mellitus, heart disease, and stroke. The DASH eating plan may also help with weight loss. WHAT DO I NEED TO KNOW ABOUT THE DASH EATING PLAN? For the DASH eating plan, you will follow these general guidelines:  Choose foods with a percent daily value for sodium of less than 5% (as listed on the food label).  Use salt-free seasonings or herbs instead of table salt or sea salt.  Check with your health care provider or pharmacist before using salt substitutes.  Eat lower-sodium products, often labeled as "lower sodium" or "no salt added."  Eat fresh foods.  Eat more vegetables, fruits, and low-fat dairy products.  Choose whole grains. Look for the word "whole" as the first word in the ingredient list.  Choose fish and skinless chicken or [place] more  often than red meat. Limit fish, poultry, and meat to 6 oz (170 g) each day.  Limit sweets, desserts, sugars, and sugary drinks.  Choose heart-healthy fats.  Limit cheese to 1 oz (28 g) per day.  Eat more home-cooked food and less restaurant, buffet, and fast food.  Limit fried foods.  Cook foods using methods other than frying.  Limit canned vegetables. If you do use them, rinse them well to decrease the sodium.  When eating at a restaurant, ask that your food be prepared with less salt, or no salt if possible. WHAT FOODS CAN I EAT? Seek help from a dietitian for individual calorie needs. Grains Whole grain or whole wheat bread. Brown rice. Whole grain or whole wheat pasta. Quinoa, bulgur, and whole grain cereals. Low-sodium cereals. Corn or whole wheat flour tortillas. Whole grain cornbread. Whole grain crackers. Low-sodium crackers. Vegetables Fresh or frozen vegetables (raw, steamed, roasted, or grilled). Low-sodium or reduced-sodium tomato and vegetable juices. Low-sodium or reduced-sodium tomato sauce and paste. Low-sodium or reduced-sodium canned vegetables.  Fruits All fresh, canned (in natural juice), or frozen fruits. Meat and Other Protein Products Ground beef (85% or leaner), grass-fed beef, or beef trimmed of fat. Skinless chicken or [place]. Ground chicken or [place]. Pork trimmed of fat. All fish and seafood. Eggs. Dried beans, peas, or lentils. Unsalted nuts and seeds. Unsalted canned beans. Dairy Low-fat dairy products, such as skim or 1% milk, 2% or reduced-fat cheeses, low-fat ricotta or  cottage cheese, or plain low-fat yogurt. Low-sodium or reduced-sodium cheeses. Fats and Oils Tub margarines without trans fats. Light or reduced-fat mayonnaise and salad dressings (reduced sodium). Avocado. Safflower, olive, or canola oils. Natural peanut or almond butter. Other Unsalted popcorn and pretzels. The items listed above may not be a complete list of recommended foods or  beverages. Contact your dietitian for more options. WHAT FOODS ARE NOT RECOMMENDED? Grains White bread. White pasta. White rice. Refined cornbread. Bagels and croissants. Crackers that contain trans fat. Vegetables Creamed or fried vegetables. Vegetables in a cheese sauce. Regular canned vegetables. Regular canned tomato sauce and paste. Regular tomato and vegetable juices. Fruits Dried fruits. Canned fruit in light or heavy syrup. Fruit juice. Meat and Other Protein Products Fatty cuts of meat. Ribs, chicken wings, bacon, sausage, bologna, salami, chitterlings, fatback, hot dogs, bratwurst, and packaged luncheon meats. Salted nuts and seeds. Canned beans with salt. Dairy Whole or 2% milk, cream, half-and-half, and cream cheese. Whole-fat or sweetened yogurt. Full-fat cheeses or blue cheese. Nondairy creamers and whipped toppings. Processed cheese, cheese spreads, or cheese curds. Condiments Onion and garlic salt, seasoned salt, table salt, and sea salt. Canned and packaged gravies. Worcestershire sauce. Tartar sauce. Barbecue sauce. Teriyaki sauce. Soy sauce, including reduced sodium. Steak sauce. Fish sauce. Oyster sauce. Cocktail sauce. Horseradish. Ketchup and mustard. Meat flavorings and tenderizers. Bouillon cubes. Hot sauce. Tabasco sauce. Marinades. Taco seasonings. Relishes. Fats and Oils Butter, stick margarine, lard, shortening, ghee, and bacon fat. Coconut, palm kernel, or palm oils. Regular salad dressings. Other Pickles and olives. Salted popcorn and pretzels. The items listed above may not be a complete list of foods and beverages to avoid. Contact your dietitian for more information. WHERE CAN I FIND MORE INFORMATION? National Heart, Lung, and Blood Institute: travelstabloid.com Document Released: 01/20/2011 Document Revised: 06/17/2013 Document Reviewed: 12/05/2012 [facility] Patient Information 2015 [place], [place]. This information is not  intended to replace advice given to you by your health care provider. Make sure you discuss any questions you have with your health care provider.    Fat and Cholesterol Restricted Diet Getting too much fat and cholesterol in your diet may cause health problems. Following this diet helps keep your fat and cholesterol at normal levels. This can keep you from getting sick. What types of fat should I choose?  Choose monosaturated and polyunsaturated fats. These are found in foods such as olive oil, canola oil, flaxseeds, walnuts, almonds, and seeds.  Eat more omega-3 fats. Good choices include salmon, mackerel, sardines, tuna, flaxseed oil, and ground flaxseeds.  Limit saturated fats. These are in animal products such as meats, butter, and cream. They can also be in plant products such as palm oil, palm kernel oil, and coconut oil.  Avoid foods with partially hydrogenated oils in them. These contain trans fats. Examples of foods that have trans fats are stick margarine, some tub margarines, cookies, crackers, and other baked goods. What general guidelines do I need to follow?  Check food labels. Look for the words "trans fat" and "saturated fat."  When preparing a meal: ? Fill half of your plate with vegetables and green salads. ? Fill one fourth of your plate with whole grains. Look for the word "whole" as the first word in the ingredient list. ? Fill one fourth of your plate with lean protein foods.  Eat more foods that have fiber, like apples, carrots, beans, peas, and barley.  Eat more home-cooked foods. Eat less at restaurants and buffets.  Limit or  avoid alcohol.  Limit foods high in starch and sugar.  Limit fried foods.  Cook foods without frying them. Baking, boiling, grilling, and broiling are all great options.  Lose weight if you are overweight. Losing even a small amount of weight can help your overall health. It can also help prevent diseases such as diabetes and heart  disease. What foods can I eat? Grains Whole grains, such as whole wheat or whole grain breads, crackers, cereals, and pasta. Unsweetened oatmeal, bulgur, barley, quinoa, or brown rice. Corn or whole wheat flour tortillas. Vegetables Fresh or frozen vegetables (raw, steamed, roasted, or grilled). Green salads. Fruits All fresh, canned (in natural juice), or frozen fruits. Meat and Other Protein Products Ground beef (85% or leaner), grass-fed beef, or beef trimmed of fat. Skinless chicken or [place]. Ground chicken or [place]. Pork trimmed of fat. All fish and seafood. Eggs. Dried beans, peas, or lentils. Unsalted nuts or seeds. Unsalted canned or dry beans. Dairy Low-fat dairy products, such as skim or 1% milk, 2% or reduced-fat cheeses, low-fat ricotta or cottage cheese, or plain low-fat yogurt. Fats and Oils Tub margarines without trans fats. Light or reduced-fat mayonnaise and salad dressings. Avocado. Olive, canola, sesame, or safflower oils. Natural peanut or almond butter (choose ones without added sugar and oil). The items listed above may not be a complete list of recommended foods or beverages. Contact your dietitian for more options. What foods are not recommended? Grains White bread. White pasta. White rice. Cornbread. Bagels, pastries, and croissants. Crackers that contain trans fat. Vegetables White potatoes. Corn. Creamed or fried vegetables. Vegetables in a cheese sauce. Fruits Dried fruits. Canned fruit in light or heavy syrup. Fruit juice. Meat and Other Protein Products Fatty cuts of meat. Ribs, chicken wings, bacon, sausage, bologna, salami, chitterlings, fatback, hot dogs, bratwurst, and packaged luncheon meats. Liver and organ meats. Dairy Whole or 2% milk, cream, half-and-half, and cream cheese. Whole milk cheeses. Whole-fat or sweetened yogurt. Full-fat cheeses. Nondairy creamers and whipped toppings. Processed cheese, cheese spreads, or cheese curds. Sweets and  Desserts Corn syrup, sugars, honey, and molasses. Candy. Jam and jelly. Syrup. Sweetened cereals. Cookies, pies, cakes, donuts, muffins, and ice cream. Fats and Oils Butter, stick margarine, lard, shortening, ghee, or bacon fat. Coconut, palm kernel, or palm oils. Beverages Alcohol. Sweetened drinks (such as sodas, lemonade, and fruit drinks or punches). The items listed above may not be a complete list of foods and beverages to avoid. Contact your dietitian for more information. This information is not intended to replace advice given to you by your health care provider. Make sure you discuss any questions you have with your health care provider. Document Released: 08/02/2011 Document Revised: 10/08/2015 Document Reviewed: 05/02/2013 Elsevier Interactive Patient Education  2018 [employer].    Telmisartan tablets What is this medicine? TELMISARTAN (tel mi SAR tan) is used to treat high blood pressure. This medicine may be used for other purposes; ask your health care provider or pharmacist if you have questions. COMMON BRAND NAME(S): Micardis What should I tell my health care provider before I take this medicine? They need to know if you have any of these conditions: -if you are on a special diet, such as a low-salt diet -kidney or liver disease -an unusual or allergic reaction to telmisartan, other medicines, foods, dyes, or preservatives -pregnant or trying to get pregnant -breast-feeding How should I use this medicine? Take this medicine by mouth with a glass of water. Follow the directions on the prescription  label. This medicine can be taken with or without food. Take your doses at regular intervals. Do not take your medicine more often than directed. Talk to your pediatrician regarding the use of this medicine in children. Special care may be needed. Overdosage: If you think you have taken too much of this medicine contact a poison control center or emergency room at once. NOTE:  This medicine is only for you. Do not share this medicine with others. What if I miss a dose? If you miss a dose, take it as soon as you can. If it is almost time for your next dose, take only that dose. Do not take double or extra doses. What may interact with this medicine? -digoxin -potassium salts or potassium supplements -warfarin This list may not describe all possible interactions. Give your health care provider a list of all the medicines, herbs, non-prescription drugs, or dietary supplements you use. Also tell them if you smoke, drink alcohol, or use illegal drugs. Some items may interact with your medicine. What should I watch for while using this medicine? Visit your doctor or health care professional for regular checks on your progress. Check your blood pressure as directed. Ask your doctor or health care professional what your blood pressure should be and when you should contact him or her. Call your doctor or health care professional if you notice an irregular or fast heart beat. Women should inform their doctor if they wish to become pregnant or think they might be pregnant. There is a potential for serious side effects to an unborn child, particularly in the second or third trimester. Talk to your health care professional or pharmacist for more information. You may get drowsy or dizzy. Do not drive, use machinery, or do anything that needs mental alertness until you know how this drug affects you. Do not stand or sit up quickly, especially if you are an older patient. This reduces the risk of dizzy or fainting spells. Alcohol can make you more drowsy and dizzy. Avoid alcoholic drinks. Avoid salt substitutes unless you are told otherwise by your doctor or health care professional. Do not treat yourself for coughs, colds, or pain while you are taking this medicine without asking your doctor or health care professional for advice. Some ingredients may increase your blood pressure. What  side effects may I notice from receiving this medicine? Side effects that you should report to your doctor or health care professional as soon as possible: -allergic reactions like skin rash, itching or hives, swelling of the face, lips, or tongue -breathing problems -dark urine -gout pain -muscle pains -slow heartbeat -trouble passing urine or change in the amount of urine -unusual bleeding or bruising -yellowing of the eyes or skin Side effects that usually do not require medical attention (report to your doctor or health care professional if they continue or are bothersome): -back pain -change in sex drive or performance -diarrhea -sore throat or stuffy nose This list may not describe all possible side effects. Call your doctor for medical advice about side effects. You may report side effects to FDA at 1-800-FDA-1088. Where should I keep my medicine? Keep out of the reach of children. Store at room temperature between 15 and 30 degrees C (59 and 86 degrees F). Tablets should not be removed from the blisters until right before use. Throw away any unused medicine after the expiration date. NOTE: This sheet is a summary. It may not cover all possible information. If you have questions about this  medicine, talk to your doctor, pharmacist, or health care provider.  2018 Elsevier/Gold Standard (2007-04-18 13:39:10)

## 2017-05-05 LAB — BASIC METABOLIC PANEL WITH GFR
BUN: 17 mg/dL (ref 7–25)
CO2: 28 mmol/L (ref 20–32)
Calcium: 9.7 mg/dL (ref 8.6–10.3)
Chloride: 101 mmol/L (ref 98–110)
Creat: 0.93 mg/dL (ref 0.60–1.35)
GFR, Est African American: 125 mL/min/{1.73_m2} (ref >=60)
GFR, Est Non African American: 107 mL/min/{1.73_m2} (ref >=60)
GLUCOSE: 88 mg/dL (ref 65–99)
POTASSIUM: 4.1 mmol/L (ref 3.5–5.3)
SODIUM: 138 mmol/L (ref 135–146)

## 2017-05-05 LAB — CBC WITH DIFFERENTIAL/PLATELET
BASOS PCT: 0.4 %
Basophils Absolute: 34 cells/uL (ref 0–200)
EOS ABS: 109 {cells}/uL (ref 15–500)
Eosinophils Relative: 1.3 %
HCT: 47.7 % (ref 38.5–50.0)
HEMOGLOBIN: 17 g/dL (ref 13.2–17.1)
Lymphs Abs: 1008 cells/uL (ref 850–3900)
MCH: 32.4 pg (ref 27.0–33.0)
MCHC: 35.6 g/dL (ref 32.0–36.0)
MCV: 91 fL (ref 80.0–100.0)
MONOS PCT: 6.4 %
MPV: 10.7 fL (ref 7.5–12.5)
NEUTROS ABS: 6712 {cells}/uL (ref 1500–7800)
Neutrophils Relative %: 79.9 %
PLATELETS: 202 10*3/uL (ref 140–400)
RBC: 5.24 10*6/uL (ref 4.20–5.80)
RDW: 12.2 % (ref 11.0–15.0)
Total Lymphocyte: 12 %
WBC mixed population: 538 cells/uL (ref 200–950)
WBC: 8.4 10*3/uL (ref 3.8–10.8)

## 2017-05-05 LAB — TESTOSTERONE: Testosterone: 1020 ng/dL — ABNORMAL HIGH (ref 250–827)

## 2017-05-05 LAB — HEPATIC FUNCTION PANEL
AG Ratio: 2.1 (calc) (ref 1.0–2.5)
ALKALINE PHOSPHATASE (APISO): 63 U/L (ref 40–115)
ALT: 33 U/L (ref 9–46)
AST: 29 U/L (ref 10–40)
Albumin: 4.8 g/dL (ref 3.6–5.1)
Bilirubin, Direct: 0.1 mg/dL (ref 0.0–0.2)
Globulin: 2.3 g/dL (calc) (ref 1.9–3.7)
Indirect Bilirubin: 0.4 mg/dL (calc) (ref 0.2–1.2)
TOTAL PROTEIN: 7.1 g/dL (ref 6.1–8.1)
Total Bilirubin: 0.5 mg/dL (ref 0.2–1.2)

## 2017-05-05 LAB — LIPID PANEL
Cholesterol: 211 mg/dL — ABNORMAL HIGH (ref <200)
HDL: 45 mg/dL (ref >40)
LDL Cholesterol (Calc): 130 mg/dL (calc) — ABNORMAL HIGH
Non-HDL Cholesterol (Calc): 166 mg/dL (calc) — ABNORMAL HIGH (ref <130)
TRIGLYCERIDES: 219 mg/dL — AB (ref <150)
Total CHOL/HDL Ratio: 4.7 (calc) (ref <5.0)

## 2017-05-05 LAB — TSH: TSH: 0.75 mIU/L (ref 0.40–4.50)

## 2017-05-05 MED ORDER — "NEEDLE (DISP) 21G X 1"" MISC": 1 refills | Status: DC

## 2017-05-05 MED ORDER — AMPHETAMINE-DEXTROAMPHETAMINE 20 MG PO TABS: ORAL_TABLET | ORAL | 0 refills | Status: DC

## 2017-05-05 MED ORDER — TESTOSTERONE CYPIONATE 200 MG/ML IM SOLN: INTRAMUSCULAR | 2 refills | Status: DC

## 2017-05-05 NOTE — Addendum Note (Signed)
Addended by: Izora Ribas on: 05/05/2017 08:31 AM   Modules accepted: Orders

## 2017-05-15 ENCOUNTER — Ambulatory Visit: Payer: 59 | Admitting: Internal Medicine

## 2017-05-15 ENCOUNTER — Encounter: Payer: Self-pay | Admitting: Internal Medicine

## 2017-05-15 VITALS — BP 130/94 | HR 84 | Temp 97.3°F | Resp 18 | Ht 72.0 in | Wt 260.4 lb

## 2017-05-15 DIAGNOSIS — D485 Neoplasm of uncertain behavior of skin: Secondary | ICD-10-CM | POA: Diagnosis not present

## 2017-05-15 DIAGNOSIS — I10 Essential (primary) hypertension: Secondary | ICD-10-CM | POA: Diagnosis not present

## 2017-05-15 NOTE — Progress Notes (Signed)
  Subjective:    Patient ID: Rhodes Calvert, male    DOB: Jun 07, 1985, 32 y.o.   MRN: [id number]  HPI  This nice 31 yo MWM is monitored expectantly w/ labile HTN reports home BP's are ranging about 140/80's . Today's BP sl elevated at 130/94 and rechecked at 130/86. HT systems review is negative    He also has concern about a dark brown to black "mole" he hs noted  ? Sl increasing in size over the last 6 month's   Medication Sig  . ALPRAZolam  1 MG tablet Take 1/2 to 1 tab 2 to 3 x / day ONLY if needed for Anxiety Attack   . ADDERALL 20 MG tablet Take 1/2 to 1 tab 2 x daily only if needed for ADD  . WELLBUTRIN XL 150 MG  Take 1 tab every morning.  Marland Kitchen VITAMIN D 1000 UNITS Take 6,000 Units daily.   . Multiple Vitamin  Take 1 tab daily.  Marland Kitchen telmisartan  80 MG tablet Take 1/2-1 tab daily to maintain blood pressure under 130/80.- prescribed last OV, but never started  . testosterone cypio 200 MG injec INJECT 2 ML'S IM EVERY 14 DAYS    No Known Allergies   Past Medical History:  Diagnosis Date  . Hypertension   . Hypogonadism male   . Vitamin D deficiency    Review of Systems  10 point systems review negative except as above.    Objective:   Physical Exam  BP (!) 130/94   Pulse 84   Temp (!) 97.3 F (36.3 C)   Resp 18   Ht 6' (1.829 m)   Wt 260 lb 6.4 oz (118.1 kg)   BMI 35.32 kg/m    Recheck BP 130/86  In no Distress  HEENT - WNL. Neck - supple.  Chest - Clear equal BS. Cor - Nl HS. RRR w/o sig MGR. PP 1(+). No edema. MS- FROM w/o deformities.  Gait Nl. Neuro -  Nl w/o focal abnormalities. Skin - there is a 3 x 3 mm circumscribed back sl raised skin lesion at the [place] area.   Procedure (CPT 11300) - after informed consent and aseptic prep and local anesthesia with 0.5 ml of Marcaine 0.5% intradermal, the above lesion was sharply excised full thickness with a 203 mm margin and sent for path analysis. Then the biopsy site was deeply hyfrecated to destroy any remnant lesion  fragment . Antibiotic sterile dsg applied . Patient was advise wound care.     Assessment & Plan:   1. Essential hypertension  - Advised continue BP monitoring  2. Neoplasm of uncertain behavior of skin  - Dermatology pathology

## 2017-05-15 NOTE — Patient Instructions (Signed)
Wound care    Keep the wound clean and dry.  If you were given a bandage (dressing), you should change it at least one time per day or as told by your doctor. You should also change it if it gets wet or dirty.  Keep the wound completely dry for the first 24 hours or as told by your doctor. After that time, you may take a shower or a bath. However, make sure that the wound is not soaked in water until after the stitches or staples have been removed.  Clean the wound one time each day or as told by your doctor: ? Wash the wound with soap and water. ? Rinse the wound with water until all of the soap comes off. ? Pat the wound dry with a clean towel. Do not rub the wound.  After you clean the wound, put a thin layer of antibiotic ointment on it as told by your doctor. This ointment: ? Helps to prevent infection. ? Keeps the bandage from sticking to the wound.  Have your stitches or staples removed as told by your doctor     General Instructions  If you were given antibiotic medicine or ointment, take or apply it as told by your doctor. Do not stop using the antibiotic even if your wound is getting better.  Do not scratch or pick at the wound.  Check your wound every day for signs of infection. Watch for: ? Redness, swelling, or pain. ? Fluid, blood, or pus. ?  ? If you have a ----  You have a fever.  A wound that was closed breaks open.  You notice a bad smell coming from your wound or your bandage.  You notice something coming out of the wound, such as wood or glass.  Medicine does not help your pain.  You have more redness, swelling, or pain at the site of your wound.  You have fluid, blood, or pus coming from your wound.  You notice a change in the color of your skin near your wound.  You need to change the bandage often because fluid, blood, or pus is coming from the wound.  You start to have a new rash.  You start to have numbness around the wound. Get help  right away if:  You have very bad swelling around the wound.  Your pain suddenly gets worse and is very bad.  You notice painful lumps near the wound or on skin that is anywhere on your body.  You have a red streak going away from your wound.

## 2017-05-29 ENCOUNTER — Other Ambulatory Visit: Payer: Self-pay | Admitting: Internal Medicine

## 2017-05-29 ENCOUNTER — Other Ambulatory Visit: Payer: Self-pay | Admitting: Adult Health

## 2017-05-29 DIAGNOSIS — F909 Attention-deficit hyperactivity disorder, unspecified type: Secondary | ICD-10-CM

## 2017-05-30 ENCOUNTER — Other Ambulatory Visit: Payer: Self-pay | Admitting: Adult Health

## 2017-05-30 DIAGNOSIS — F909 Attention-deficit hyperactivity disorder, unspecified type: Secondary | ICD-10-CM

## 2017-05-30 MED ORDER — ALPRAZOLAM 1 MG PO TABS: ORAL_TABLET | ORAL | 0 refills | Status: DC

## 2017-05-30 NOTE — Progress Notes (Signed)
Patient requesting early referral of xanax and adderall due to upcoming termination of insurance. Patient was contacted to discuss adderall will not be refilled due to last refill being less than 4 weeks ago, and typical prescription should last him well beyond a month. Xanax will be refilled due to last fill being full 30 days ago, reminded needs to limit use to avoid tolerance.

## 2017-07-16 ENCOUNTER — Other Ambulatory Visit: Payer: Self-pay | Admitting: Internal Medicine

## 2017-08-04 ENCOUNTER — Other Ambulatory Visit: Payer: Self-pay | Admitting: Adult Health

## 2017-08-04 ENCOUNTER — Other Ambulatory Visit: Payer: Self-pay

## 2017-08-04 ENCOUNTER — Ambulatory Visit (INDEPENDENT_AMBULATORY_CARE_PROVIDER_SITE_OTHER): Payer: BLUE CROSS/BLUE SHIELD

## 2017-08-04 VITALS — BP 140/82

## 2017-08-04 DIAGNOSIS — F909 Attention-deficit hyperactivity disorder, unspecified type: Secondary | ICD-10-CM

## 2017-08-04 DIAGNOSIS — I10 Essential (primary) hypertension: Secondary | ICD-10-CM | POA: Diagnosis not present

## 2017-08-04 LAB — BASIC METABOLIC PANEL WITH GFR
BUN: 19 mg/dL (ref 7–25)
CO2: 26 mmol/L (ref 20–32)
Calcium: 9.5 mg/dL (ref 8.6–10.3)
Chloride: 105 mmol/L (ref 98–110)
Creat: 1.08 mg/dL (ref 0.60–1.35)
GFR, EST AFRICAN AMERICAN: 104 mL/min/{1.73_m2} (ref >=60)
GFR, EST NON AFRICAN AMERICAN: 90 mL/min/{1.73_m2} (ref >=60)
Glucose, Bld: 75 mg/dL (ref 65–99)
POTASSIUM: 4.2 mmol/L (ref 3.5–5.3)
SODIUM: 139 mmol/L (ref 135–146)

## 2017-08-04 MED ORDER — AMPHETAMINE-DEXTROAMPHETAMINE 20 MG PO TABS
ORAL_TABLET | ORAL | 0 refills | Status: DC
Start: 2017-08-04 — End: 2017-08-04

## 2017-08-04 MED ORDER — AMPHETAMINE-DEXTROAMPHETAMINE 20 MG PO TABS: ORAL_TABLET | ORAL | 0 refills | Status: DC

## 2017-08-04 NOTE — Progress Notes (Signed)
Patient presents to the office for a nurse visit for a recheck of BP and for a lab draw. BP reading was 140/82. Patient currently taking Telmisartan, 80mg  tablet. Taking 1/2 tablet daily.  Patient informed to take 1 full tablet and to continue to work on his diet and will follow up in October at his next appointment.

## 2017-08-04 NOTE — Telephone Encounter (Signed)
Refill request for Adderall. PMP checked, last filled on 03/19/17, #60 for 30 days. In que for your review.

## 2017-08-24 ENCOUNTER — Other Ambulatory Visit: Payer: Self-pay | Admitting: Internal Medicine

## 2017-08-24 DIAGNOSIS — F909 Attention-deficit hyperactivity disorder, unspecified type: Secondary | ICD-10-CM

## 2017-08-24 MED ORDER — ALPRAZOLAM 1 MG PO TABS: ORAL_TABLET | ORAL | 0 refills | Status: DC

## 2017-11-13 ENCOUNTER — Other Ambulatory Visit: Payer: Self-pay | Admitting: Internal Medicine

## 2017-11-13 DIAGNOSIS — E349 Endocrine disorder, unspecified: Secondary | ICD-10-CM

## 2017-11-20 ENCOUNTER — Encounter: Payer: Self-pay | Admitting: Internal Medicine

## 2017-12-12 ENCOUNTER — Ambulatory Visit (INDEPENDENT_AMBULATORY_CARE_PROVIDER_SITE_OTHER): Payer: Managed Care, Other (non HMO) | Admitting: Internal Medicine

## 2017-12-12 ENCOUNTER — Encounter: Payer: Self-pay | Admitting: Internal Medicine

## 2017-12-12 VITALS — BP 134/84 | HR 80 | Temp 97.5°F | Resp 18 | Ht 72.0 in | Wt 263.8 lb

## 2017-12-12 DIAGNOSIS — Z130 Encounter for screening for diseases of the blood and blood-forming organs and certain disorders involving the immune mechanism: Secondary | ICD-10-CM | POA: Diagnosis not present

## 2017-12-12 DIAGNOSIS — Z136 Encounter for screening for cardiovascular disorders: Secondary | ICD-10-CM

## 2017-12-12 DIAGNOSIS — Z1211 Encounter for screening for malignant neoplasm of colon: Secondary | ICD-10-CM

## 2017-12-12 DIAGNOSIS — E349 Endocrine disorder, unspecified: Secondary | ICD-10-CM

## 2017-12-12 DIAGNOSIS — E559 Vitamin D deficiency, unspecified: Secondary | ICD-10-CM | POA: Diagnosis not present

## 2017-12-12 DIAGNOSIS — I10 Essential (primary) hypertension: Secondary | ICD-10-CM

## 2017-12-12 DIAGNOSIS — Z1389 Encounter for screening for other disorder: Secondary | ICD-10-CM | POA: Diagnosis not present

## 2017-12-12 DIAGNOSIS — Z1329 Encounter for screening for other suspected endocrine disorder: Secondary | ICD-10-CM

## 2017-12-12 DIAGNOSIS — Z13220 Encounter for screening for lipoid disorders: Secondary | ICD-10-CM | POA: Diagnosis not present

## 2017-12-12 DIAGNOSIS — Z131 Encounter for screening for diabetes mellitus: Secondary | ICD-10-CM

## 2017-12-12 DIAGNOSIS — Z0000 Encounter for general adult medical examination without abnormal findings: Secondary | ICD-10-CM

## 2017-12-12 DIAGNOSIS — R5383 Other fatigue: Secondary | ICD-10-CM

## 2017-12-12 DIAGNOSIS — Z79899 Other long term (current) drug therapy: Secondary | ICD-10-CM

## 2017-12-12 DIAGNOSIS — E782 Mixed hyperlipidemia: Secondary | ICD-10-CM

## 2017-12-12 DIAGNOSIS — R7309 Other abnormal glucose: Secondary | ICD-10-CM

## 2017-12-12 DIAGNOSIS — Z1212 Encounter for screening for malignant neoplasm of rectum: Secondary | ICD-10-CM

## 2017-12-12 DIAGNOSIS — Z0001 Encounter for general adult medical examination with abnormal findings: Secondary | ICD-10-CM

## 2017-12-12 DIAGNOSIS — Z23 Encounter for immunization: Secondary | ICD-10-CM | POA: Diagnosis not present

## 2017-12-12 DIAGNOSIS — Z111 Encounter for screening for respiratory tuberculosis: Secondary | ICD-10-CM

## 2017-12-12 NOTE — Progress Notes (Signed)
[facility]   Unk Pinto, M.D.     Uvaldo Bristle. Silverio Lay, P.A.-C Liane Comber, [place]                [address] [place], [place] [address] Telephone [phone] Telefax [phone] Annual  Screening/Preventative Visit  & Comprehensive Evaluation & Examination     This very nice 32 y.o. MWM presents for a Screening /Preventative Visit & comprehensive evaluation and management of multiple medical co-morbidities.  Patient has been followed for labile HTN, HLD, Prediabetes and Vitamin D Deficiency. Patient has ADD and is on Adderall with improved focus & concentration at work.    History of labile HTN predates since 2011. Patient's BP has been controlled at home.  Today's BP is at goal - 134/84. Patient denies any cardiac symptoms as chest pain, palpitations, shortness of breath, dizziness or ankle swelling. BP Readings from Last 3 Encounters:  12/12/17 134/84  08/04/17 140/82  05/15/17 (!) 130/94      Patient's hyperlipidemia is not controlled with diet and medications. Patient denies myalgias or other medication SE's. Last lipids were not at goal:  Lab Results  Component Value Date   CHOL 211 (H) 05/04/2017   HDL 45 05/04/2017   LDLCALC 130 (H) 05/04/2017   TRIG 219 (H) 05/04/2017   CHOLHDL 4.7 05/04/2017      Patient has Morbid Obesity & is screened proactively for  prediabetes and patient denies reactive hypoglycemic symptoms, visual blurring, diabetic polys or paresthesias. Last A1c was Normal & at goal: Lab Results  Component Value Date   HGBA1C 4.6 10/28/2016       Patient has hx/o low Testosterone ("192" in 2010) and is on parenteral replacement with improved stamina and sense of well being.      Finally, patient has history of Vitamin D Deficiency ("8" / 2008) and last vitamin D was still low : Lab Results  Component Value Date   VD25OH 57 10/28/2016   Current Outpatient  Medications on File Prior to Visit  Medication Sig  . ALPRAZolam (XANAX) 1 MG tablet Take 1/2 to 1 tablet 2 to 3 x / day ONLY if needed for Anxiety Attack or Sleep  & please try to limit to 5 days /week to avoid addiction  . amphetamine-dextroamphetamine (ADDERALL) 20 MG tablet Take 1/2 to 1 tablet 2 x daily only if needed for ADD  . buPROPion (WELLBUTRIN XL) 150 MG 24 hr tablet TAKE 1 TABLET BY MOUTH EVERY DAY IN THE MORNING  . cholecalciferol (VITAMIN D) 1000 UNITS tablet Take 6,000 Units by mouth daily.   . Multiple Vitamin (MULTIVITAMIN) tablet Take 1 tablet by mouth daily.  Marland Kitchen NEEDLE, DISP, 21 G (YALE DISP NEEDLES 21GX1") 21G X 1" MISC 1 & 1/2 "     21 G needles and 3 cc syringes  . Syringe, Disposable, (LUER LOCK SAFETY SYRINGES) 3 ML MISC As Directed  . telmisartan (MICARDIS) 80 MG tablet Take 1/2-1 tab by mouth daily to maintain blood pressure under 130/80.  Marland Kitchen testosterone cypionate (DEPOTESTOSTERONE CYPIONATE) 200 MG/ML injection Inject 2 ml.s IM every 2 weeks.   No current facility-administered medications on file prior to visit.    Not on File Past Medical History:  Diagnosis Date  . Hypertension   . Hypogonadism male   . Vitamin D deficiency    Health Maintenance  Topic Date  Due  . HIV Screening  09/26/1998  . INFLUENZA VACCINE  12/14/2018 (Originally 09/14/2017)  . TETANUS/TDAP  01/15/2019   Immunization History  Administered Date(s) Administered  . Influenza Whole 01/18/2010  . PPD Test 06/21/2013, 06/23/2014, 09/14/2015, 10/28/2016  . Pneumococcal Polysaccharide-23 01/18/2010  . Tdap 01/14/2009   Past Surgical History:  Procedure Laterality Date  . KNEE ARTHROSCOPY Left   . WISDOM TOOTH EXTRACTION     Family History  Problem Relation Age of Onset  . Arthritis Mother   . Hyperlipidemia Father   . Hypertension Father   . Diabetes Father   . Gout Father    Social History   Socioeconomic History  . Marital status: Single    Spouse name: Not on file  .  Number of children: Not on file  Occupational History  . Not on file  Tobacco Use  . Smoking status: Former Smoker    Packs/day: 0.00    Last attempt to quit: 03/17/2012    Years since quitting: 5.7  . Smokeless tobacco: Never Used  . Tobacco comment: smoke occasionally, about 1 cigarette a week  Substance and Sexual Activity  . Alcohol use: Yes    Alcohol/week: 20.0 standard drinks    Types: 20 Standard drinks or equivalent per week    Comment: beer-drinks 12-15 per week  . Drug use: No  . Sexual activity: Not on file    ROS Constitutional: Denies fever, chills, weight loss/gain, headaches, insomnia,  night sweats or change in appetite. Does c/o fatigue. Eyes: Denies redness, blurred vision, diplopia, discharge, itchy or watery eyes.  ENT: Denies discharge, congestion, post nasal drip, epistaxis, sore throat, earache, hearing loss, dental pain, Tinnitus, Vertigo, Sinus pain or snoring.  Cardio: Denies chest pain, palpitations, irregular heartbeat, syncope, dyspnea, diaphoresis, orthopnea, PND, claudication or edema Respiratory: denies cough, dyspnea, DOE, pleurisy, hoarseness, laryngitis or wheezing.  Gastrointestinal: Denies dysphagia, heartburn, reflux, water brash, pain, cramps, nausea, vomiting, bloating, diarrhea, constipation, hematemesis, melena, hematochezia, jaundice or hemorrhoids Genitourinary: Denies dysuria, frequency, urgency, nocturia, hesitancy, discharge, hematuria or flank pain Musculoskeletal: Denies arthralgia, myalgia, stiffness, Jt. Swelling, pain, limp or strain/sprain. Denies Falls. Skin: Denies puritis, rash, hives, warts, acne, eczema or change in skin lesion Neuro: No weakness, tremor, incoordination, spasms, paresthesia or pain Psychiatric: Denies confusion, memory loss or sensory loss. Denies Depression. Endocrine: Denies change in weight, skin, hair change, nocturia, and paresthesia, diabetic polys, visual blurring or hyper / hypo glycemic episodes.   Heme/Lymph: No excessive bleeding, bruising or enlarged lymph nodes.  Physical Exam  BP 134/84   Pulse 80   Temp (!) 97.5 F (36.4 C)   Resp 18   Ht 6' (1.829 m)   Wt 263 lb 12.8 oz (119.7 kg)   BMI 35.78 kg/m   General Appearance: Well nourished and well groomed and in no apparent distress.  Eyes: PERRLA, EOMs, conjunctiva no swelling or erythema, normal fundi and vessels. Sinuses: No frontal/maxillary tenderness ENT/Mouth: EACs patent / TMs  nl. Nares clear without erythema, swelling, mucoid exudates. Oral hygiene is good. No erythema, swelling, or exudate. Tongue normal, non-obstructing. Tonsils not swollen or erythematous. Hearing normal.  Neck: Supple, thyroid not palpable. No bruits, nodes or JVD. Respiratory: Respiratory effort normal.  BS equal and clear bilateral without rales, rhonci, wheezing or stridor. Cardio: Heart sounds are normal with regular rate and rhythm and no murmurs, rubs or gallops. Peripheral pulses are normal and equal bilaterally without edema. No aortic or femoral bruits. Chest: symmetric with normal excursions and percussion.  Abdomen: Soft, with Nl bowel sounds. Nontender, no guarding, rebound, hernias, masses, or organomegaly.  Lymphatics: Non tender without lymphadenopathy.  Musculoskeletal: Full ROM all peripheral extremities, joint stability, 5/5 strength, and normal gait. Skin: Warm and dry without rashes, lesions, cyanosis, clubbing or  ecchymosis.  Neuro: Cranial nerves intact, reflexes equal bilaterally. Normal muscle tone, no cerebellar symptoms. Sensation intact.  Pysch: Alert and oriented X 3 with normal affect, insight and judgment appropriate.   Assessment and Plan  1. Annual Preventative/Screening Exam   2. Essential hypertension  - Urinalysis, Routine w reflex microscopic - Microalbumin / creatinine urine ratio - CBC with Differential/Platelet - COMPLETE METABOLIC PANEL WITH GFR - Magnesium - TSH - EKG 12-Lead  3.  Hyperlipemia, mixed  - Lipid panel  4. Abnormal glucose, screening  - Hemoglobin A1c - Insulin, random  5. Vitamin D deficiency  - VITAMIN D 25 Hydroxyl  6. Testosterone deficiency  - Testosterone  7. Screening for colorectal cancer  - POC Hemoccult Bld/Stl   8. Screening examination for pulmonary tuberculosis  - PPD  9. Screening for ischemic heart disease   10. Fatigue  - Iron,Total/Total Iron Binding Cap - Vitamin B12 - Testosterone - CBC with Differential/Platelet  11. Medication management  - Urinalysis, Routine w reflex microscopic - Microalbumin / creatinine urine ratio - CBC with Differential/Platelet - COMPLETE METABOLIC PANEL WITH GFR - Magnesium - Lipid panel - TSH - Hemoglobin A1c - Insulin, random - VITAMIN D 25 Hydroxy (Vit-D Deficiency, Fractures)  12. Need for prophylactic vaccination and inoculation against influenza  - FLU VACCINE MDCK QUAD W/Preservative          Patient was counseled in prudent diet, weight control to achieve/maintain BMI less than 25, BP monitoring, regular exercise and medications as discussed.  Discussed med effects and SE's. Routine screening labs and tests as requested with regular follow-up as recommended. Over 40 minutes of exam, counseling, chart review and high complex critical decision making was performed

## 2017-12-12 NOTE — Patient Instructions (Signed)

## 2017-12-13 ENCOUNTER — Other Ambulatory Visit: Payer: Self-pay

## 2017-12-13 ENCOUNTER — Other Ambulatory Visit: Payer: Self-pay | Admitting: Internal Medicine

## 2017-12-13 DIAGNOSIS — F909 Attention-deficit hyperactivity disorder, unspecified type: Secondary | ICD-10-CM

## 2017-12-13 DIAGNOSIS — E782 Mixed hyperlipidemia: Secondary | ICD-10-CM

## 2017-12-13 LAB — TSH: TSH: 1.28 mIU/L (ref 0.40–4.50)

## 2017-12-13 LAB — URINALYSIS, ROUTINE W REFLEX MICROSCOPIC
Bilirubin Urine: NEGATIVE
GLUCOSE, UA: NEGATIVE
HGB URINE DIPSTICK: NEGATIVE
KETONES UR: NEGATIVE
Leukocytes, UA: NEGATIVE
Nitrite: NEGATIVE
PH: 6 (ref 5.0–8.0)
Protein, ur: NEGATIVE
Specific Gravity, Urine: 1.009 (ref 1.001–1.03)

## 2017-12-13 LAB — CBC WITH DIFFERENTIAL/PLATELET
BASOS PCT: 0.5 %
Basophils Absolute: 41 cells/uL (ref 0–200)
EOS ABS: 154 {cells}/uL (ref 15–500)
EOS PCT: 1.9 %
HCT: 45.3 % (ref 38.5–50.0)
HEMOGLOBIN: 16 g/dL (ref 13.2–17.1)
Lymphs Abs: 1150 cells/uL (ref 850–3900)
MCH: 32.3 pg (ref 27.0–33.0)
MCHC: 35.3 g/dL (ref 32.0–36.0)
MCV: 91.5 fL (ref 80.0–100.0)
MONOS PCT: 6.1 %
MPV: 10.4 fL (ref 7.5–12.5)
NEUTROS ABS: 6261 {cells}/uL (ref 1500–7800)
Neutrophils Relative %: 77.3 %
PLATELETS: 205 10*3/uL (ref 140–400)
RBC: 4.95 10*6/uL (ref 4.20–5.80)
RDW: 12.5 % (ref 11.0–15.0)
Total Lymphocyte: 14.2 %
WBC mixed population: 494 cells/uL (ref 200–950)
WBC: 8.1 10*3/uL (ref 3.8–10.8)

## 2017-12-13 LAB — COMPLETE METABOLIC PANEL WITH GFR
AG Ratio: 2.1 (calc) (ref 1.0–2.5)
ALKALINE PHOSPHATASE (APISO): 64 U/L (ref 40–115)
ALT: 34 U/L (ref 9–46)
AST: 27 U/L (ref 10–40)
Albumin: 5.1 g/dL (ref 3.6–5.1)
BILIRUBIN TOTAL: 0.6 mg/dL (ref 0.2–1.2)
BUN: 24 mg/dL (ref 7–25)
CHLORIDE: 98 mmol/L (ref 98–110)
CO2: 29 mmol/L (ref 20–32)
CREATININE: 1.18 mg/dL (ref 0.60–1.35)
Calcium: 9.8 mg/dL (ref 8.6–10.3)
GFR, Est African American: 93 mL/min/{1.73_m2} (ref >=60)
GFR, Est Non African American: 80 mL/min/{1.73_m2} (ref >=60)
GLUCOSE: 84 mg/dL (ref 65–99)
Globulin: 2.4 g/dL (calc) (ref 1.9–3.7)
Potassium: 4.1 mmol/L (ref 3.5–5.3)
SODIUM: 136 mmol/L (ref 135–146)
Total Protein: 7.5 g/dL (ref 6.1–8.1)

## 2017-12-13 LAB — LIPID PANEL
CHOLESTEROL: 222 mg/dL — AB (ref <200)
HDL: 43 mg/dL (ref >40)
Non-HDL Cholesterol (Calc): 179 mg/dL (calc) — ABNORMAL HIGH (ref <130)
TRIGLYCERIDES: 419 mg/dL — AB (ref <150)
Total CHOL/HDL Ratio: 5.2 (calc) — ABNORMAL HIGH (ref <5.0)

## 2017-12-13 LAB — IRON, TOTAL/TOTAL IRON BINDING CAP
%SAT: 23 % (ref 20–48)
Iron: 89 ug/dL (ref 50–180)
TIBC: 379 mcg/dL (calc) (ref 250–425)

## 2017-12-13 LAB — HEMOGLOBIN A1C
Hgb A1c MFr Bld: 4.9 % of total Hgb (ref <5.7)
Mean Plasma Glucose: 94 (calc)
eAG (mmol/L): 5.2 (calc)

## 2017-12-13 LAB — TESTOSTERONE: Testosterone: 622 ng/dL (ref 250–827)

## 2017-12-13 LAB — MAGNESIUM: Magnesium: 1.9 mg/dL (ref 1.5–2.5)

## 2017-12-13 LAB — VITAMIN D 25 HYDROXY (VIT D DEFICIENCY, FRACTURES): VIT D 25 HYDROXY: 50 ng/mL (ref 30–100)

## 2017-12-13 LAB — MICROALBUMIN / CREATININE URINE RATIO
Creatinine, Urine: 46 mg/dL (ref 20–320)
Microalb Creat Ratio: 20 mcg/mg creat (ref <30)
Microalb, Ur: 0.9 mg/dL

## 2017-12-13 LAB — VITAMIN B12: VITAMIN B 12: 522 pg/mL (ref 200–1100)

## 2017-12-13 LAB — INSULIN, RANDOM: Insulin: 4.9 u[IU]/mL (ref 2.0–19.6)

## 2017-12-13 MED ORDER — AMPHETAMINE-DEXTROAMPHETAMINE 20 MG PO TABS: ORAL_TABLET | ORAL | 0 refills | Status: DC

## 2017-12-13 MED ORDER — ALPRAZOLAM 1 MG PO TABS: ORAL_TABLET | ORAL | 0 refills | Status: DC

## 2017-12-13 MED ORDER — ROSUVASTATIN CALCIUM 40 MG PO TABS: ORAL_TABLET | ORAL | 1 refills | Status: DC

## 2018-01-26 ENCOUNTER — Other Ambulatory Visit: Payer: Self-pay | Admitting: Adult Health

## 2018-01-27 ENCOUNTER — Other Ambulatory Visit: Payer: Self-pay | Admitting: Internal Medicine

## 2018-01-27 DIAGNOSIS — E349 Endocrine disorder, unspecified: Secondary | ICD-10-CM

## 2018-01-27 DIAGNOSIS — E291 Testicular hypofunction: Secondary | ICD-10-CM

## 2018-02-19 ENCOUNTER — Other Ambulatory Visit: Payer: Self-pay

## 2018-02-19 DIAGNOSIS — F909 Attention-deficit hyperactivity disorder, unspecified type: Secondary | ICD-10-CM

## 2018-02-19 MED ORDER — AMPHETAMINE-DEXTROAMPHETAMINE 20 MG PO TABS: ORAL_TABLET | ORAL | 0 refills | Status: DC

## 2018-03-27 DIAGNOSIS — F418 Other specified anxiety disorders: Secondary | ICD-10-CM | POA: Insufficient documentation

## 2018-03-27 NOTE — Progress Notes (Deleted)
FOLLOW UP  Assessment and Plan:   Hypertension *** telmisartan  Monitor blood pressure at home; patient to call if consistently greater than 130/80 Continue DASH diet.   Reminder to go to the ER if any CP, SOB, nausea, dizziness, severe HA, changes vision/speech, left arm numbness and tingling and jaw pain.  Cholesterol Newly on rosuvastatin ***; trigs remain severely elevated, consider fenofibrate if remains above goal  Discussed lifestyle for trigs, recommended he significantly decrease alcohol intake *** Continue low cholesterol diet and exercise.  Check lipid panel.   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 Can contact back at any point to initiate this.  Lifestyle discussed: diet/exerise, sleep hygiene, stress management, hydration  ADD Continue medications Helps with focus, no AE's. The patient was counseled on the addictive nature of the medication and was encouraged to take drug holidays when not needed.   Hypogonadism - continue replacement therapy, check testosterone levels as needed.   Continue diet and meds as discussed. Further disposition pending results of labs. Discussed med's effects and SE's.   Over 30 minutes of exam, counseling, chart review, and critical decision making was performed.   Future Appointments  Date Time Provider [place]  03/28/2018  8:45 AM Liane Comber, NP [facility] None  07/04/2018  4:30 PM Unk Pinto, MD [facility] None  01/16/2019  3:00 PM Unk Pinto, MD [facility] None    ----------------------------------------------------------------------------------------------------------------------  HPI 33 y.o. male  presents for 6 month follow up on hypertension, cholesterol, depression/anxiety, ADD, obesity and vitamin D deficiency.   he has a diagnosis of depression/anxiety and is currently on wellbutrin 150 mg daily, xanax 0.5-1 mg TID PRN, reports symptoms are well controlled on current regimen. he currently takes a xanax once daily 5/7 days. He has been under increased stress at work due to disagreements.   Patient is on an ADD medication, taking adderall 20 mg BID PRN, he states that the medication is helping and he denies any adverse reactions. 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   Alcohol ***  BMI is There is no height or weight on file to calculate BMI., he has not been working on diet and exercise. Wt Readings from Last 3 Encounters:  12/12/17 263 lb 12.8 oz (119.7 kg)  05/15/17 260 lb 6.4 oz (118.1 kg)  05/04/17 255 lb (115.7 kg)   Today their BP is    He does not workout. He denies chest pain, shortness of breath, dizziness.   He is on cholesterol medication (rosuvastatin 20 mg every other day since last visit) and denies myalgias. His cholesterol is not at goal. The cholesterol last visit was:   Lab Results  Component Value Date   CHOL 222 (H) 12/12/2017   HDL 43 12/12/2017   LDLCALC  12/12/2017     Comment:     . LDL cholesterol not calculated. Triglyceride levels greater than 400 mg/dL  invalidate calculated LDL results. . Reference range: <100 . Desirable range <100 mg/dL for primary prevention;   <70 mg/dL for patients with CHD or diabetic patients  with > or = 2 CHD risk factors. Marland Kitchen LDL-C is now calculated using the Martin-Hopkins   calculation, which is a validated novel method providing  better accuracy than the Friedewald equation in the  estimation of LDL-C.  Cresenciano Genre et al. Annamaria Helling. 5784;696(29): 2061-2068  (http://education.[employer].com/faq/FAQ164)    TRIG 419 (H) 12/12/2017   CHOLHDL 5.2 (H) 12/12/2017   Last A1C in the office was:  Lab Results  Component Value Date   HGBA1C 4.9 12/12/2017   Patient is on Vitamin D supplement but remained below goal of 70 at last check:   Lab Results  Component Value Date   VD25OH 41 12/12/2017     He has a history of testosterone deficiency and is on testosterone replacement, taking *** He states that the testosterone helps with his energy, libido, muscle mass. Lab Results  Component Value Date   TESTOSTERONE 622 12/12/2017      Current Medications:  Current Outpatient Medications on File Prior to Visit  Medication Sig  . ALPRAZolam (XANAX) 1 MG tablet Take 1/2 to 1 tablet 2 to 3 x / day ONLY if needed for Anxiety Attack or Sleep  & please try to limit to 5 days /week to avoid addiction  . amphetamine-dextroamphetamine (ADDERALL) 20 MG tablet Take 1/2 to 1 tablet 2 x daily only if needed for ADD  . buPROPion (WELLBUTRIN XL) 150 MG 24 hr tablet TAKE 1 TABLET BY MOUTH EVERY DAY IN THE MORNING  . cholecalciferol (VITAMIN D) 1000 UNITS tablet Take 6,000 Units by mouth daily.   . Multiple Vitamin (MULTIVITAMIN) tablet Take 1 tablet by mouth daily.  Marland Kitchen NEEDLE, DISP, 21 G 21G X 1" MISC Use as directed  . rosuvastatin (CRESTOR) 40 MG tablet Take 1/2 to 1 tablet daily or as directed for Cholesterol  . Syringe, Disposable, (LUER LOCK SAFETY SYRINGES) 3 ML MISC As Directed  . telmisartan (MICARDIS) 80 MG tablet Take 1/2-1 tab by mouth daily to maintain blood pressure under 130/80.  Marland Kitchen testosterone cypionate (DEPOTESTOSTERONE CYPIONATE) 200 MG/ML injection Inject 2 ml.s IM every 2 weeks.   No current facility-administered medications on file prior to visit.       Allergies: No Known Allergies   Medical History:  Past Medical History:  Diagnosis Date  . Hypertension   . Hypogonadism male   . Vitamin D deficiency    Family history- Reviewed and unchanged Social history- Reviewed and unchanged   Review of Systems:  Review of Systems  Constitutional: Negative for malaise/fatigue and weight loss.  HENT: Negative for hearing loss and tinnitus.   Eyes: Negative for blurred vision and double vision.  Respiratory: Negative for cough, shortness of breath and wheezing.   Cardiovascular: Negative for chest pain, palpitations, orthopnea, claudication and leg swelling.  Gastrointestinal: Negative for abdominal pain, blood in stool, constipation, diarrhea, heartburn, melena, nausea and vomiting.  Genitourinary: Negative.   Musculoskeletal: Negative for joint pain and myalgias.  Skin: Negative for rash.  Neurological: Negative for dizziness, tingling, sensory change, weakness and headaches.  Endo/Heme/Allergies: Negative for polydipsia.  Psychiatric/Behavioral: Positive for substance abuse (Excessive alcohol use). Negative for depression, memory loss and suicidal ideas. The patient is nervous/anxious and has insomnia.   All other systems reviewed and are negative.    Physical Exam: There were no vitals taken for this visit. Wt Readings from  Last 3 Encounters:  12/12/17 263 lb 12.8 oz (119.7 kg)  05/15/17 260 lb 6.4 oz (118.1 kg)  05/04/17 255 lb (115.7 kg)   General Appearance: Well nourished, in no apparent distress. Eyes: PERRLA, EOMs, conjunctiva no swelling or erythema Sinuses: No Frontal/maxillary tenderness ENT/Mouth: Ext aud canals clear, TMs without erythema, bulging. No erythema, swelling, or exudate on post pharynx.  Tonsils not swollen or erythematous. Hearing normal.  Neck: Supple, thyroid normal.  Respiratory: Respiratory effort normal, BS equal bilaterally without rales, rhonchi, wheezing or stridor.  Cardio: RRR with no  MRGs. Brisk peripheral pulses without edema.  Abdomen: Soft, + BS.  Non tender, no guarding, rebound, hernias, masses. Lymphatics: Non tender without lymphadenopathy.  Musculoskeletal: Full ROM, 5/5 strength, Normal gait Skin: Warm, dry without rashes, lesions, ecchymosis.  Neuro: Cranial nerves intact. No cerebellar symptoms.  Psych: Awake and oriented X 3, normal affect, Insight and Judgment appropriate.    Izora Ribas, NP 1:25 PM [facility]

## 2018-03-28 ENCOUNTER — Ambulatory Visit: Payer: Self-pay | Admitting: Adult Health

## 2018-03-29 ENCOUNTER — Other Ambulatory Visit: Payer: Self-pay | Admitting: Adult Health

## 2018-03-29 DIAGNOSIS — I10 Essential (primary) hypertension: Secondary | ICD-10-CM

## 2018-04-18 NOTE — Progress Notes (Signed)
FOLLOW UP  Assessment and Plan:   Hypertension Continue telmisartan 40 mg daily for now; if BPs trending down further with anticipated weight loss, will taper to D/C Monitor blood pressure at home; patient to call if consistently greater than 130/80 Continue DASH diet.   Reminder to go to the ER if any CP, SOB, nausea, dizziness, severe HA, changes vision/speech, left arm numbness and tingling and jaw pain.  Cholesterol Reports normalized cholesterol with fasting test since last visit, never started rosuvastatin, has cut back significantly on alcohol  Discussed lifestyle for trigs He would prefer to avoid meds and is optimistic about lifestyle modification  Continue low cholesterol diet and exercise.  Check lipid panel.   Obesity with co morbidities Long discussion about weight loss, diet, and exercise Recommended diet heavy in fruits and veggies and low in animal meats, cheeses, and dairy products, appropriate calorie intake Discussed ideal weight for height  Patient will work on exercise, cutting down on alcohol Will follow up in 3 months  Vitamin D Def Below goal at last visit; continue supplementation to maintain goal of 60-100 He did increase dose after last check to 8000 IU dialy  Defer Vit D level to next visit   Depression/anxiety Continue medications - patient feels symptoms well managed with current regimen; reminded to limit benzo use to <5 days per week  Lifestyle discussed: diet/exerise, sleep hygiene, stress management, hydration  ADD Continue medications Helps with focus, no AE's. The patient was counseled on the addictive nature of the medication and was encouraged to take drug holidays when not needed.   Hypogonadism Off of supplement and working on weight loss, exercise, zinc supplement for natural testosterone management.   R serous otitis media Effusion- no infection- suggest flonase/nasonex, allergy pills, and hold nose while drinking water to  autoinflate, oral steroids offered if drainage from ear, fever, chills, HA, nausea, call the office or go to the ER  Continue diet and meds as discussed. Further disposition pending results of labs. Discussed med's effects and SE's.   Over 30 minutes of exam, counseling, chart review, and critical decision making was performed.   Future Appointments  Date Time Provider [place]  07/04/2018  4:30 PM Unk Pinto, MD [facility] None  01/16/2019  3:00 PM Unk Pinto, MD [facility] None    ----------------------------------------------------------------------------------------------------------------------  HPI 33 y.o. male  presents for 6 month follow up on hypertension, cholesterol, depression/anxiety, ADD, obesity and vitamin D deficiency.   He reports cough, congestion, felt unwell last week but doing much better; has residual R ear pressure/intermittent pain with popping. Denies headache, changes in hearing, fever/chills or discharge.   he has a diagnosis of depression/anxiety and is currently on wellbutrin 150 mg daily, xanax 0.5-1 mg TID PRN, reports symptoms are well controlled on current regimen. he currently takes a xanax once daily 5/7 days. He has been under increased stress at work due to disagreements.   Patient is on an ADD medication, taking adderall 20 mg BID PRN, he states that the medication is helping and he denies any adverse reactions. 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   He has history of heavy drinking, he reports previously drinking 6-10 beers nightly, recently drinking 2-3 beers per night.   BMI is Body mass index is 35.59 kg/m., he has been working on diet and exercise, making better choices and pushing away from  table soon. He is not currently exercising much Wt Readings from Last 3 Encounters:  04/19/18 262 lb 6.4 oz (119 kg)   12/12/17 263 lb 12.8 oz (119.7 kg)  05/15/17 260 lb 6.4 oz (118.1 kg)   Today their BP is BP: 110/78 Blood pressure has improved since starting on lifestyle and stopping testosterone, taking 40 mg of telmisartan only   He does workout. He denies chest pain, shortness of breath, dizziness.   He is not on cholesterol medication (he was recommended rosuvastatin 20 mg every other day at last visit, but had FASTING cholesterol rechecked at work which was reportedly normal so never started medication). His cholesterol is not at goal. The cholesterol last visit was:   Lab Results  Component Value Date   CHOL 222 (H) 12/12/2017   HDL 43 12/12/2017   LDLCALC  12/12/2017     Comment:     . LDL cholesterol not calculated. Triglyceride levels greater than 400 mg/dL invalidate calculated LDL results. . Reference range: <100 . Desirable range <100 mg/dL for primary prevention;   <70 mg/dL for patients with CHD or diabetic patients  with > or = 2 CHD risk factors. Marland Kitchen LDL-C is now calculated using the Martin-Hopkins  calculation, which is a validated novel method providing  better accuracy than the Friedewald equation in the  estimation of LDL-C.  Cresenciano Genre et al. Annamaria Helling. 9629;528(41): 2061-2068  (http://education.[facility].com/faq/FAQ164)    TRIG 419 (H) 12/12/2017   CHOLHDL 5.2 (H) 12/12/2017   Last A1C in the office was:  Lab Results  Component Value Date   HGBA1C 4.9 12/12/2017   Patient is on Vitamin D supplement but remained below goal of 60 at last check:   Lab Results  Component Value Date   VD25OH 45 12/12/2017     He has a history of testosterone deficiency and was on testosterone replacement, but reports he stopped taking a few weeks ago and would like to be off for a while as he and his wife are trying to conceiving. He states that the testosterone does help with his energy, muscle mass. Lab Results  Component Value Date   TESTOSTERONE 622 12/12/2017      Current  Medications:  Current Outpatient Medications on File Prior to Visit  Medication Sig  . ALPRAZolam (XANAX) 1 MG tablet Take 1/2 to 1 tablet 2 to 3 x / day ONLY if needed for Anxiety Attack or Sleep  & please try to limit to 5 days /week to avoid addiction  . amphetamine-dextroamphetamine (ADDERALL) 20 MG tablet Take 1/2 to 1 tablet 2 x daily only if needed for ADD  . buPROPion (WELLBUTRIN XL) 150 MG 24 hr tablet TAKE 1 TABLET BY MOUTH EVERY DAY IN THE MORNING  . cholecalciferol (VITAMIN D) 1000 UNITS tablet Take 10,000 Units by mouth daily.   . Multiple Vitamin (MULTIVITAMIN) tablet Take 1 tablet by mouth daily.  Marland Kitchen telmisartan (MICARDIS) 80 MG tablet TAKE 1/2-1 TAB BY MOUTH DAILY TO MAINTAIN BLOOD PRESSURE UNDER 130/80.  Marland Kitchen NEEDLE, DISP, 21 G 21G X 1" MISC Use as directed (Patient not taking: Reported on 04/19/2018)  . rosuvastatin (CRESTOR) 40 MG tablet Take 1/2 to 1 tablet daily or as directed for Cholesterol (Patient not taking: Reported on 04/19/2018)  . Syringe, Disposable, (LUER LOCK SAFETY SYRINGES) 3 ML MISC As Directed (Patient not taking: Reported on 04/19/2018)  . testosterone cypionate (DEPOTESTOSTERONE CYPIONATE) 200 MG/ML injection Inject 2 ml.s IM every 2 weeks. (Patient not taking: Reported on  04/19/2018)   No current facility-administered medications on file prior to visit.      Allergies: No Known Allergies   Medical History:  Past Medical History:  Diagnosis Date  . Hypertension   . Hypogonadism male   . Vitamin D deficiency    Family history- Reviewed and unchanged Social history- Reviewed and unchanged   Review of Systems:  Review of Systems  Constitutional: Negative for malaise/fatigue and weight loss.  HENT: Negative for hearing loss and tinnitus.   Eyes: Negative for blurred vision and double vision.  Respiratory: Negative for cough, shortness of breath and wheezing.   Cardiovascular: Negative for chest pain, palpitations, orthopnea, claudication and leg swelling.   Gastrointestinal: Negative for abdominal pain, blood in stool, constipation, diarrhea, heartburn, melena, nausea and vomiting.  Genitourinary: Negative.   Musculoskeletal: Negative for joint pain and myalgias.  Skin: Negative for rash.  Neurological: Negative for dizziness, tingling, sensory change, weakness and headaches.  Endo/Heme/Allergies: Negative for polydipsia.  Psychiatric/Behavioral: Negative for depression, memory loss, substance abuse (Has cut down on alcohol to 2-3 beers nightly ) and suicidal ideas. The patient is nervous/anxious. The patient does not have insomnia.   All other systems reviewed and are negative.   Physical Exam: BP 110/78   Pulse 93   Temp 98.1 F (36.7 C)   Ht 6' (1.829 m)   Wt 262 lb 6.4 oz (119 kg)   SpO2 98%   BMI 35.59 kg/m  Wt Readings from Last 3 Encounters:  04/19/18 262 lb 6.4 oz (119 kg)  12/12/17 263 lb 12.8 oz (119.7 kg)  05/15/17 260 lb 6.4 oz (118.1 kg)   General Appearance: Well nourished, in no apparent distress. Eyes: PERRLA, EOMs, conjunctiva no swelling or erythema Sinuses: No Frontal/maxillary tenderness ENT/Mouth: Ext aud canals clear, L TM without erythema, bulging. R TM with erythema, no bulging, no purulent/white appearance. No erythema, swelling, or exudate on post pharynx.  Tonsils not swollen or erythematous. Hearing normal.  Neck: Supple, thyroid normal.  Respiratory: Respiratory effort normal, BS equal bilaterally without rales, rhonchi, wheezing or stridor.  Cardio: RRR with no MRGs. Brisk peripheral pulses without edema.  Abdomen: Soft, + BS.  Non tender, no guarding, rebound, hernias, masses. Lymphatics: Non tender without lymphadenopathy.  Musculoskeletal: Full ROM, 5/5 strength, Normal gait Skin: Warm, dry without rashes, lesions, ecchymosis.  Neuro: Cranial nerves intact. No cerebellar symptoms.  Psych: Awake and oriented X 3, normal affect, Insight and Judgment appropriate.    Izora Ribas, NP 8:51  AM [facility]

## 2018-04-19 ENCOUNTER — Ambulatory Visit (INDEPENDENT_AMBULATORY_CARE_PROVIDER_SITE_OTHER): Payer: Managed Care, Other (non HMO) | Admitting: Adult Health

## 2018-04-19 ENCOUNTER — Encounter: Payer: Self-pay | Admitting: Adult Health

## 2018-04-19 VITALS — BP 110/78 | HR 93 | Temp 98.1°F | Ht 72.0 in | Wt 262.4 lb

## 2018-04-19 DIAGNOSIS — E559 Vitamin D deficiency, unspecified: Secondary | ICD-10-CM | POA: Diagnosis not present

## 2018-04-19 DIAGNOSIS — F418 Other specified anxiety disorders: Secondary | ICD-10-CM

## 2018-04-19 DIAGNOSIS — Z789 Other specified health status: Secondary | ICD-10-CM

## 2018-04-19 DIAGNOSIS — Z79899 Other long term (current) drug therapy: Secondary | ICD-10-CM

## 2018-04-19 DIAGNOSIS — H6501 Acute serous otitis media, right ear: Secondary | ICD-10-CM

## 2018-04-19 DIAGNOSIS — E782 Mixed hyperlipidemia: Secondary | ICD-10-CM | POA: Diagnosis not present

## 2018-04-19 DIAGNOSIS — F909 Attention-deficit hyperactivity disorder, unspecified type: Secondary | ICD-10-CM

## 2018-04-19 DIAGNOSIS — R945 Abnormal results of liver function studies: Secondary | ICD-10-CM | POA: Diagnosis not present

## 2018-04-19 DIAGNOSIS — E349 Endocrine disorder, unspecified: Secondary | ICD-10-CM

## 2018-04-19 DIAGNOSIS — F988 Other specified behavioral and emotional disorders with onset usually occurring in childhood and adolescence: Secondary | ICD-10-CM

## 2018-04-19 DIAGNOSIS — I10 Essential (primary) hypertension: Secondary | ICD-10-CM | POA: Diagnosis not present

## 2018-04-19 MED ORDER — ALPRAZOLAM 1 MG PO TABS: ORAL_TABLET | ORAL | 0 refills | Status: AC

## 2018-04-19 MED ORDER — TELMISARTAN 80 MG PO TABS: ORAL_TABLET | ORAL | 1 refills | Status: AC

## 2018-04-19 MED ORDER — PREDNISONE 20 MG PO TABS: ORAL_TABLET | ORAL | 0 refills | Status: AC

## 2018-04-19 MED ORDER — AMPHETAMINE-DEXTROAMPHETAMINE 20 MG PO TABS: ORAL_TABLET | ORAL | 0 refills | Status: AC

## 2018-04-19 NOTE — Patient Instructions (Addendum)
Goals    . Blood Pressure < 130/80    . Exercise 150 min/wk Moderate Activity    . LDL CALC < 100    . Weight (lb) < 230 lb (104.3 kg)       STOP or cut back to 1/4 tab for blood pressure meds if values at home getting down into 90/60s    Can do zinc 40-50 mg a day Can try shake that is whey protein, almond milk, avocado oil, and spinach and strawberries in the morning  9 Ways to Naturally Increase Testosterone Levels  1.   Lose Weight If you're overweight, shedding the excess pounds may increase your testosterone levels, according to research presented at the Endocrine Society's 2012 meeting. Overweight men are more likely to have low testosterone levels to begin with, so this is an important trick to increase your body's testosterone production when you need it most.  2.   High-Intensity Exercise like Peak Fitness  Short intense exercise has a proven positive effect on increasing testosterone levels and preventing its decline. That's unlike aerobics or prolonged moderate exercise, which have shown to have negative or no effect on testosterone levels. Having a whey protein meal after exercise can further enhance the satiety/testosterone-boosting impact (hunger hormones cause the opposite effect on your testosterone and libido). Here's a summary of what a typical high-intensity Peak Fitness routine might look like: " Warm up for three minutes  " Exercise as hard and fast as you can for 30 seconds. You should feel like you couldn't possibly go on another few seconds  " Recover at a slow to moderate pace for 90 seconds  " Repeat the high intensity exercise and recovery 7 more times .  3.   Consume Plenty of Zinc The mineral zinc is important for testosterone production, and supplementing your diet for as little as six weeks has been shown to cause a marked improvement in testosterone among men with low levels.1 Likewise, research has shown that restricting dietary sources of zinc leads to  a significant decrease in testosterone, while zinc supplementation increases it2 -- and even protects men from exercised-induced reductions in testosterone levels.3 It's estimated that up to 90 percent of adults over the age of 60 may have lower than recommended zinc intakes; even when dietary supplements were added in, an estimated 20-25 percent of older adults still had inadequate zinc intakes, according to a [employer] and Nutrition Examination Survey.4 Your diet is the best source of zinc; along with protein-rich foods like meats and fish, other good dietary sources of zinc include raw milk, raw cheese, beans, and yogurt or kefir made from raw milk. It can be difficult to obtain enough dietary zinc if you're a vegetarian, and also for meat-eaters as well, largely because of conventional farming methods that rely heavily on chemical fertilizers and pesticides. These chemicals deplete the soil of nutrients ... nutrients like zinc that must be absorbed by plants in order to be passed on to you. In many cases, you may further deplete the nutrients in your food by the way you prepare it. For most food, cooking it will drastically reduce its levels of nutrients like zinc ... particularly over-cooking, which many people do. If you decide to use a zinc supplement, stick to a dosage of less than 40 mg a day, as this is the recommended adult upper limit. Taking too much zinc can interfere with your body's ability to absorb other minerals, especially copper, and may cause nausea as  a side effect.  4.   Strength Training In addition to Peak Fitness, strength training is also known to boost testosterone levels, provided you are doing so intensely enough. When strength training to boost testosterone, you'll want to increase the weight and lower your number of reps, and then focus on exercises that work a large number of muscles, such as dead lifts or squats.  You can "turbo-charge" your weight training by going  slower. By slowing down your movement, you're actually turning it into a high-intensity exercise. Super Slow movement allows your muscle, at the microscopic level, to access the maximum number of cross-bridges between the protein filaments that produce movement in the muscle.   5.   Optimize Your Vitamin D Levels Vitamin D, a steroid hormone, is essential for the healthy development of the nucleus of the sperm cell, and helps maintain semen quality and sperm count. Vitamin D also increases levels of testosterone, which may boost libido. In one study, overweight men who were given vitamin D supplements had a significant increase in testosterone levels after one year.5   6.   Reduce Stress When you're under a lot of stress, your body releases high levels of the stress hormone cortisol. This hormone actually blocks the effects of testosterone,6 presumably because, from a biological standpoint, testosterone-associated behaviors (mating, competing, aggression) may have lowered your chances of survival in an emergency (hence, the "fight or flight" response is dominant, courtesy of cortisol).  7.   Limit or Eliminate Sugar from Your Diet Testosterone levels decrease after you eat sugar, which is likely because the sugar leads to a high insulin level, another factor leading to low testosterone.7 Based on USDA estimates, the average American consumes 12 teaspoons of sugar a day, which equates to about TWO TONS of sugar during a lifetime.  8.   Eat Healthy Fats By healthy, this means not only mon- and polyunsaturated fats, like that found in avocadoes and nuts, but also saturated, as these are essential for building testosterone. Research shows that a diet with less than 40 percent of energy as fat (and that mainly from animal sources, i.e. saturated) lead to a decrease in testosterone levels.8 My personal diet is about 60-70 percent healthy fat, and other experts agree that the ideal diet includes somewhere  between 50-70 percent fat.  It's important to understand that your body requires saturated fats from animal and vegetable sources (such as meat, dairy, certain oils, and tropical plants like coconut) for optimal functioning, and if you neglect this important food group in favor of sugar, grains and other starchy carbs, your health and weight are almost guaranteed to suffer. Examples of healthy fats you can eat more of to give your testosterone levels a boost include: Olives and Olive oil  Coconuts and coconut oil Butter made from raw grass-fed organic milk Raw nuts, such as, almonds or pecans Organic pastured egg yolks Avocados Grass-fed meats Palm oil Unheated organic nut oils   9.   Boost Your Intake of Branch Chain Amino Acids (BCAA) from Foods Like [place] suggests that BCAAs result in higher testosterone levels, particularly when taken along with resistance training.9 While BCAAs are available in supplement form, you'll find the highest concentrations of BCAAs like leucine in dairy products - especially quality cheeses and whey protein. Even when getting leucine from your natural food supply, it's often wasted or used as a building block instead of an anabolic agent. So to create the correct anabolic environment, you need to  boost leucine consumption way beyond mere maintenance levels. That said, keep in mind that using leucine as a free form amino acid can be highly counterproductive as when free form amino acids are artificially administrated, they rapidly enter your circulation while disrupting insulin function, and impairing your body's glycemic control. Food-based leucine is really the ideal form that can benefit your muscles without side effects.     HOW TO TREAT VIRAL COUGH AND COLD SYMPTOMS:  -Symptoms usually last at least 1 week with the worst symptoms being around day 4.  - colds usually start with a sore throat and end with a cough, and the cough can take 2 weeks to get  better.  -No antibiotics are needed for colds, flu, sore throats, cough, bronchitis UNLESS symptoms are longer than 7 days OR if you are getting better then get drastically worse.  -There are a lot of combination medications (Dayquil, Nyquil, Vicks 44, tyelnol cold and sinus, ETC). Please look at the ingredients on the back so that you are treating the correct symptoms and not doubling up on medications/ingredients.    Medicines you can use  Nasal congestion  Little Remedies saline spray (aerosol/mist)- can try this, it is in the kids section - pseudoephedrine (Sudafed)- behind the counter, do not use if you have high blood pressure, medicine that have -D in them.  - phenylephrine (Sudafed PE) -Dextormethorphan + chlorpheniramine (Coridcidin HBP)- okay if you have high blood pressure -Oxymetazoline (Afrin) nasal spray- LIMIT to 3 days -Saline nasal spray -Neti pot (used distilled or bottled water)  Ear pain/congestion  -pseudoephedrine (sudafed) - Nasonex/flonase nasal spray  Fever  -Acetaminophen (Tyelnol) -Ibuprofen (Advil, motrin, aleve)  Sore Throat  -Acetaminophen (Tyelnol) -Ibuprofen (Advil, motrin, aleve) -Drink a lot of water -Gargle with salt water - Rest your voice (don't talk) -Throat sprays -Cough drops  Body Aches  -Acetaminophen (Tyelnol) -Ibuprofen (Advil, motrin, aleve)  Headache  -Acetaminophen (Tyelnol) -Ibuprofen (Advil, motrin, aleve) - Exedrin, Exedrin Migraine  Allergy symptoms (cough, sneeze, runny nose, itchy eyes) -Claritin or loratadine cheapest but likely the weakest  -Zyrtec or certizine at night because it can make you sleepy -The strongest is allegra or fexafinadine  Cheapest at [employer], [employer], [employer]  Cough  -Dextromethorphan (Delsym)- medicine that has DM in it -Guafenesin (Mucinex/Robitussin) - cough drops - drink lots of water  Chest Congestion  -Guafenesin (Mucinex/Robitussin)  Red Itchy Eyes  - Naphcon-A  Upset Stomach  -  Bland diet (nothing spicy, greasy, fried, and high acid foods like tomatoes, oranges, berries) -OKAY- cereal, bread, soup, crackers, rice -Eat smaller more frequent meals -reduce caffeine, no alcohol -Loperamide (Imodium-AD) if diarrhea -Prevacid for heart burn  General health when sick  -Hydration -wash your hands frequently -keep surfaces clean -change pillow cases and sheets often -Get fresh air but do not exercise strenuously -Vitamin D, double up on it - Vitamin C -Zinc

## 2018-04-20 ENCOUNTER — Other Ambulatory Visit: Payer: Self-pay | Admitting: Adult Health

## 2018-04-20 ENCOUNTER — Encounter: Payer: Self-pay | Admitting: Adult Health

## 2018-04-20 DIAGNOSIS — R945 Abnormal results of liver function studies: Secondary | ICD-10-CM

## 2018-04-20 DIAGNOSIS — R7989 Other specified abnormal findings of blood chemistry: Secondary | ICD-10-CM

## 2018-04-20 LAB — COMPLETE METABOLIC PANEL WITH GFR
AG Ratio: 1.6 (calc) (ref 1.0–2.5)
ALKALINE PHOSPHATASE (APISO): 79 U/L (ref 36–130)
ALT: 83 U/L — AB (ref 9–46)
AST: 45 U/L — AB (ref 10–40)
Albumin: 4.6 g/dL (ref 3.6–5.1)
BILIRUBIN TOTAL: 0.7 mg/dL (ref 0.2–1.2)
BUN: 20 mg/dL (ref 7–25)
CHLORIDE: 103 mmol/L (ref 98–110)
CO2: 27 mmol/L (ref 20–32)
CREATININE: 0.99 mg/dL (ref 0.60–1.35)
Calcium: 9.6 mg/dL (ref 8.6–10.3)
GFR, Est African American: 115 mL/min/{1.73_m2} (ref >=60)
GFR, Est Non African American: 99 mL/min/{1.73_m2} (ref >=60)
GLUCOSE: 82 mg/dL (ref 65–99)
Globulin: 2.9 g/dL (calc) (ref 1.9–3.7)
Potassium: 4.5 mmol/L (ref 3.5–5.3)
Sodium: 138 mmol/L (ref 135–146)
Total Protein: 7.5 g/dL (ref 6.1–8.1)

## 2018-04-20 LAB — CBC WITH DIFFERENTIAL/PLATELET
Absolute Monocytes: 529 cells/uL (ref 200–950)
Basophils Absolute: 47 cells/uL (ref 0–200)
Basophils Relative: 0.7 %
EOS ABS: 201 {cells}/uL (ref 15–500)
Eosinophils Relative: 3 %
HCT: 46 % (ref 38.5–50.0)
Hemoglobin: 16 g/dL (ref 13.2–17.1)
LYMPHS ABS: 1461 {cells}/uL (ref 850–3900)
MCH: 32.1 pg (ref 27.0–33.0)
MCHC: 34.8 g/dL (ref 32.0–36.0)
MCV: 92.4 fL (ref 80.0–100.0)
MONOS PCT: 7.9 %
MPV: 10.2 fL (ref 7.5–12.5)
NEUTROS ABS: 4462 {cells}/uL (ref 1500–7800)
Neutrophils Relative %: 66.6 %
PLATELETS: 230 10*3/uL (ref 140–400)
RBC: 4.98 10*6/uL (ref 4.20–5.80)
RDW: 12.5 % (ref 11.0–15.0)
Total Lymphocyte: 21.8 %
WBC: 6.7 10*3/uL (ref 3.8–10.8)

## 2018-04-20 LAB — LIPID PANEL
Cholesterol: 220 mg/dL — ABNORMAL HIGH (ref <200)
HDL: 43 mg/dL (ref >=40)
LDL Cholesterol (Calc): 124 mg/dL (calc) — ABNORMAL HIGH
Non-HDL Cholesterol (Calc): 177 mg/dL (calc) — ABNORMAL HIGH (ref <130)
Total CHOL/HDL Ratio: 5.1 (calc) — ABNORMAL HIGH (ref <5.0)
Triglycerides: 372 mg/dL — ABNORMAL HIGH (ref <150)

## 2018-04-20 LAB — MAGNESIUM: Magnesium: 2 mg/dL (ref 1.5–2.5)

## 2018-04-20 LAB — GAMMA GT: GGT: 136 U/L — ABNORMAL HIGH (ref 3–90)

## 2018-04-20 LAB — TEST AUTHORIZATION

## 2018-04-20 LAB — TSH: TSH: 0.72 mIU/L (ref 0.40–4.50)

## 2018-07-04 ENCOUNTER — Ambulatory Visit: Payer: Self-pay | Admitting: Internal Medicine

## 2018-07-30 ENCOUNTER — Other Ambulatory Visit: Payer: Self-pay | Admitting: Adult Health

## 2018-10-24 ENCOUNTER — Other Ambulatory Visit: Payer: Self-pay

## 2018-10-24 DIAGNOSIS — Z20822 Contact with and (suspected) exposure to covid-19: Secondary | ICD-10-CM

## 2018-10-25 LAB — NOVEL CORONAVIRUS, NAA: SARS-CoV-2, NAA: DETECTED — AB

## 2018-10-29 ENCOUNTER — Telehealth: Payer: Self-pay | Admitting: Critical Care Medicine

## 2018-10-29 NOTE — Telephone Encounter (Signed)
I spoke to the patient who was tested positive for COVID on September 9 he is rapidly improving he knows he needs to stay in isolation for 10 days from his symptom onset he also knows the health department may be in touch with him.  He knows to go to urgent care or emergency room if his symptoms worsen.  He does have a primary care provider and will follow-up with a primary care provider

## 2018-12-06 ENCOUNTER — Encounter: Payer: Self-pay | Admitting: Internal Medicine

## 2019-01-16 ENCOUNTER — Encounter: Payer: Self-pay | Admitting: Internal Medicine

## 2019-04-12 IMAGING — MR MR SHOULDER*R* W/CM
4 of 6 series · 19 of 40 positions shown · IV contrast (agent unspecified)
Comparison: None.

CLINICAL DATA: Severe right shoulder pain and limited range of
motion since a fall while playing basketball three weeks ago.

EXAM:
MR ARTHROGRAM OF THE RIGHT SHOULDER
TECHNIQUE: Multiplanar, multisequence MR imaging of the right shoulder was
performed following the administration of intra-articular contrast.
CONTRAST:  Plain films right shoulder performed at [REDACTED] 03/30/2017. Images from contrast injection reviewed.

[Series 3: T2 fat-sat · axial · 3.0mm · 0.27mm/px · z∈[-76,+21]mm · 7 of 29 slices shown (1 of 3)]
[im 1/29]
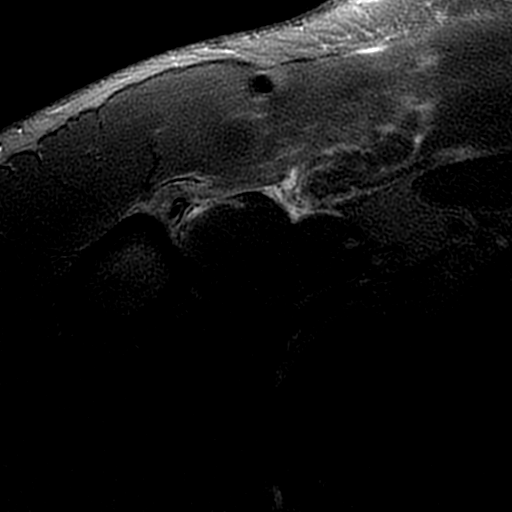
[im 5/29]
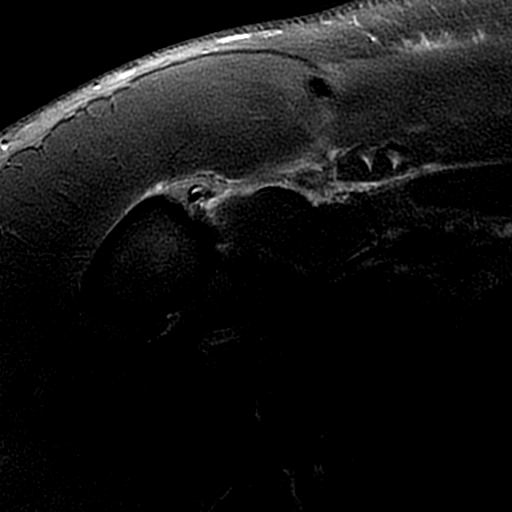
[im 10/29]
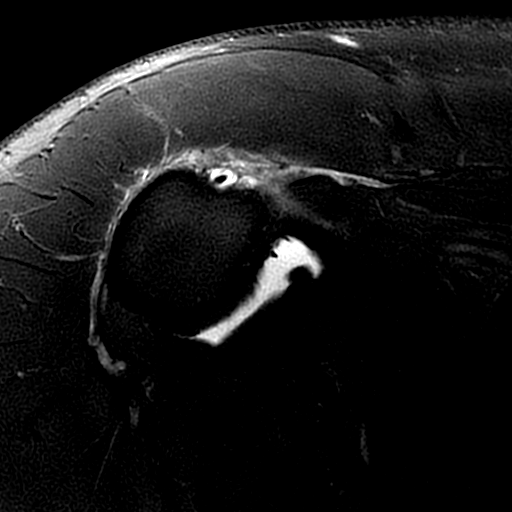
[im 15/29]
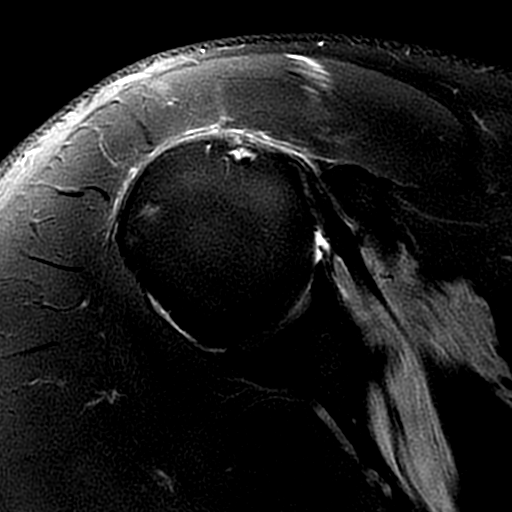
[im 19/29]
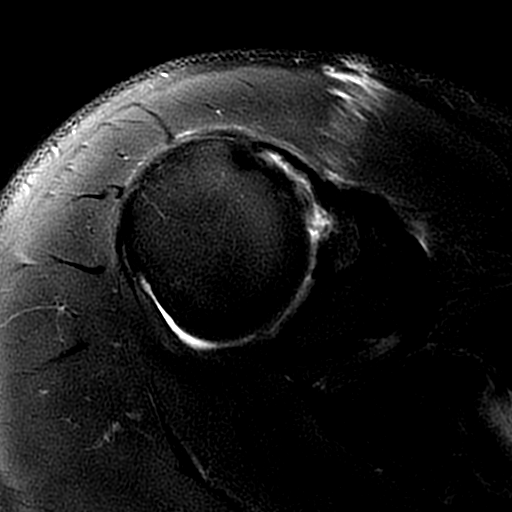
[im 24/29]
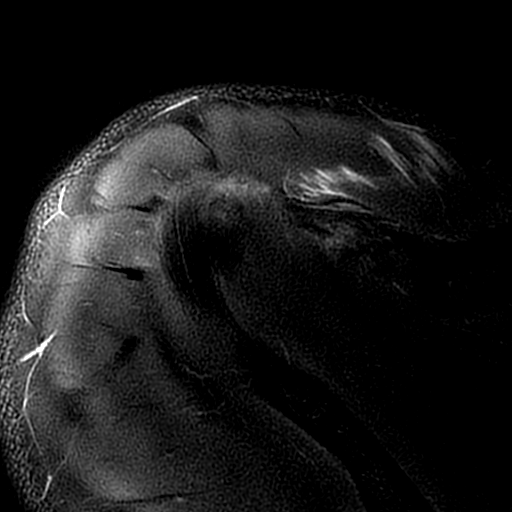
[im 29/29]
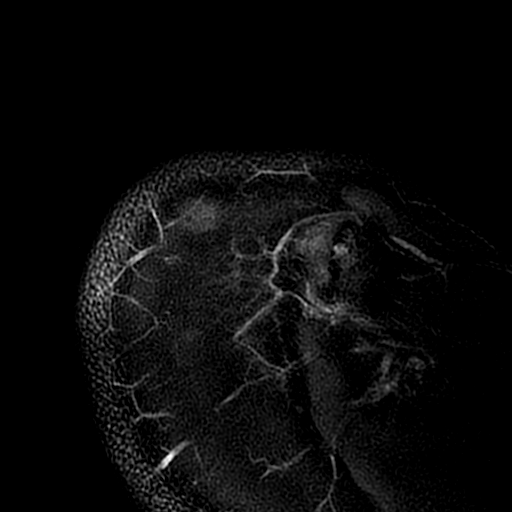

[Series 4: T2 fat-sat · oblique · 3.0mm · 0.31mm/px · 6 of 22 slices shown (2 of 3)]
[im 1/22]
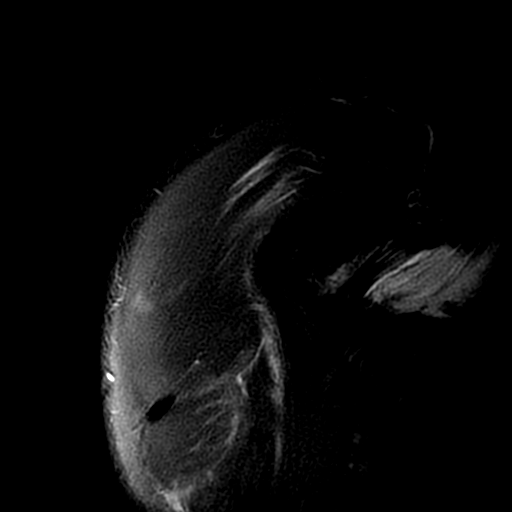
[im 5/22]
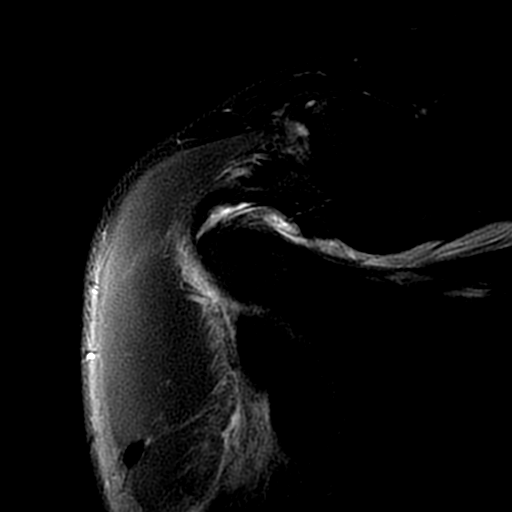
[im 9/22]
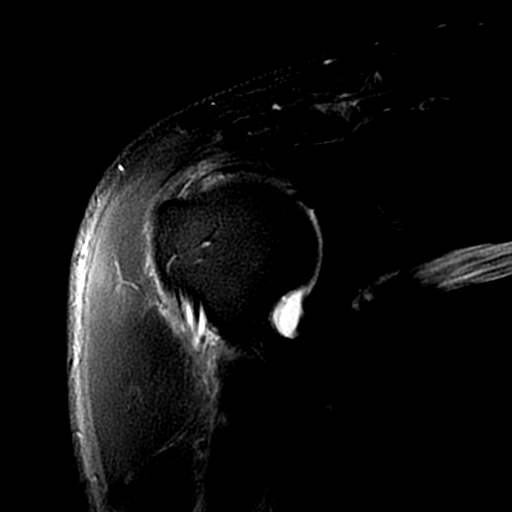
[im 13/22]
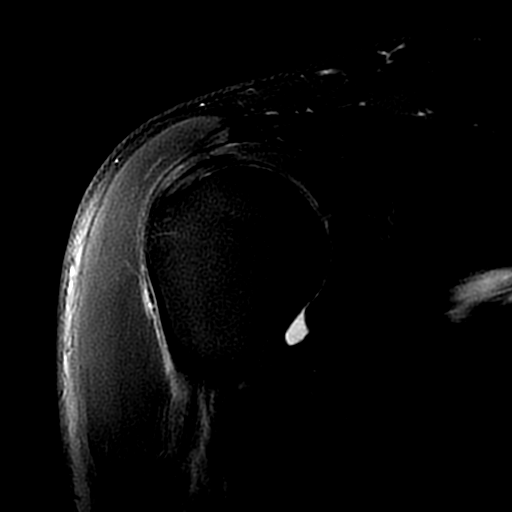
[im 17/22]
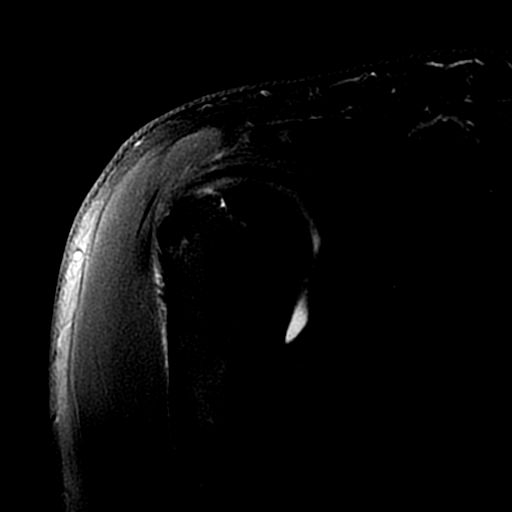
[im 22/22]
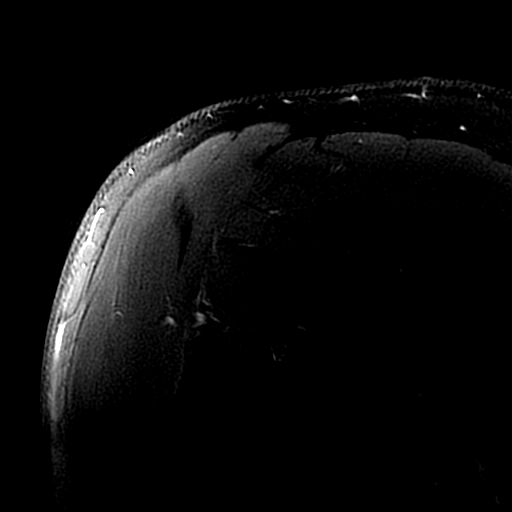

[Series 5: T2 fat-sat · coronal · 3.0mm · 0.27mm/px · 3 of 30 slices shown (3 of 3)]
[im 5/30]
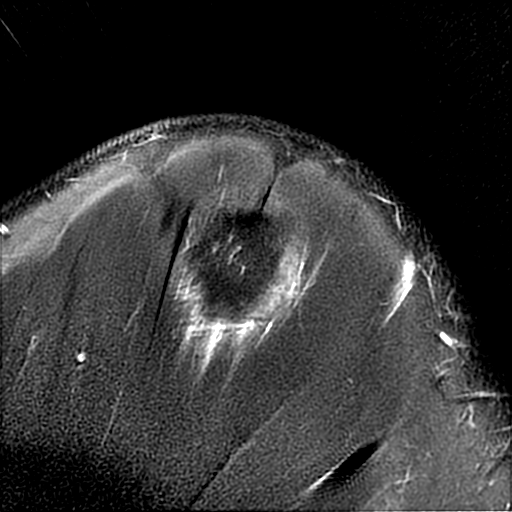
[im 17/30]
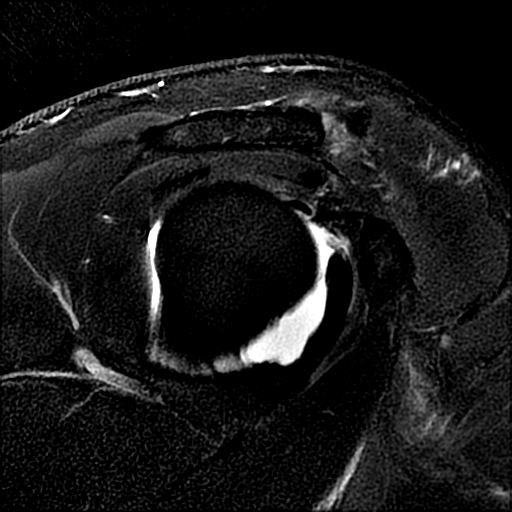
[im 25/30]
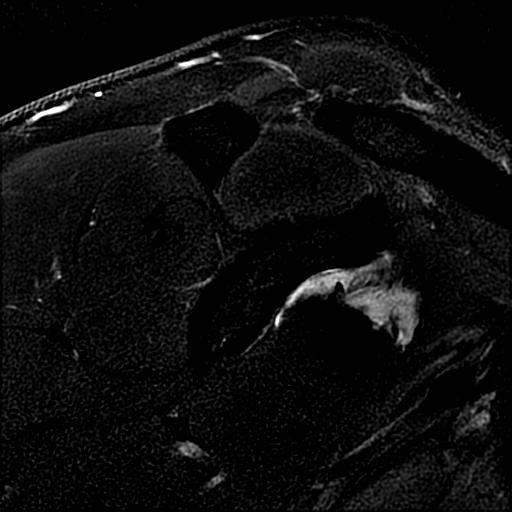

[Series 6: T1 fat-sat · axial · 3.0mm · 0.27mm/px · z∈[-62,+4]mm · 3 of 29 slices shown]
[im 5/29]
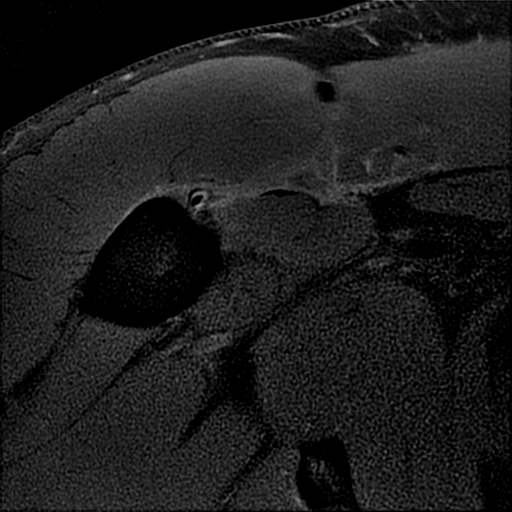
[im 15/29]
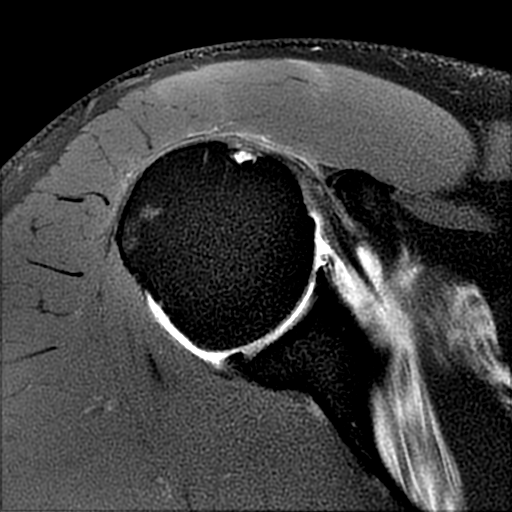
[im 24/29]
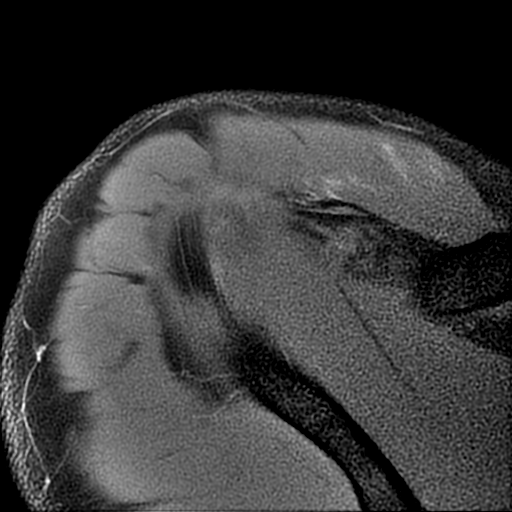

[19 of 40 positions shown; findings below may reference images not displayed]

FINDINGS: Rotator cuff: Mild to moderate rotator cuff tendinopathy without
tear is most notable in the supraspinatus.

Muscles: Normal without atrophy or focal lesion.

Biceps long head: Intact and normal in appearance.

Acromioclavicular Joint: Moderate degenerative disease appears
advanced for age.

Glenohumeral Joint: Appears normal.

Labrum: Intact.

Bones: No fracture or worrisome lesion.
IMPRESSION: Mild to moderate rotator cuff tendinopathy without tear.

Intact labrum and long head of biceps.

Moderate acromioclavicular osteoarthritis appears somewhat advanced
for age.

## 2021-09-29 ENCOUNTER — Ambulatory Visit: Payer: Managed Care, Other (non HMO) | Admitting: Nurse Practitioner
